# Patient Record
Sex: Female | Born: 1960 | Race: White | Hispanic: No | Marital: Married | Smoking: Never smoker
Health system: Southern US, Community
[De-identification: ages and names within clinical notes are randomized; demographics above are authoritative.]

## PROBLEM LIST (undated history)

## (undated) DIAGNOSIS — G4730 Sleep apnea, unspecified: Secondary | ICD-10-CM

## (undated) DIAGNOSIS — F419 Anxiety disorder, unspecified: Secondary | ICD-10-CM

## (undated) DIAGNOSIS — N200 Calculus of kidney: Secondary | ICD-10-CM

## (undated) DIAGNOSIS — I10 Essential (primary) hypertension: Secondary | ICD-10-CM

## (undated) DIAGNOSIS — K219 Gastro-esophageal reflux disease without esophagitis: Secondary | ICD-10-CM

## (undated) DIAGNOSIS — F32A Depression, unspecified: Secondary | ICD-10-CM

## (undated) DIAGNOSIS — T7840XA Allergy, unspecified, initial encounter: Secondary | ICD-10-CM

## (undated) DIAGNOSIS — M1990 Unspecified osteoarthritis, unspecified site: Secondary | ICD-10-CM

## (undated) DIAGNOSIS — J45909 Unspecified asthma, uncomplicated: Secondary | ICD-10-CM

## (undated) HISTORY — PX: OTHER SURGICAL HISTORY: SHX169

## (undated) HISTORY — DX: Depression, unspecified: F32.A

## (undated) HISTORY — DX: Allergy, unspecified, initial encounter: T78.40XA

## (undated) HISTORY — PX: OVARY SURGERY: SHX727

## (undated) HISTORY — DX: Unspecified osteoarthritis, unspecified site: M19.90

## (undated) HISTORY — DX: Anxiety disorder, unspecified: F41.9

## (undated) HISTORY — PX: PARATHYROID EXPLORATION: SHX732

---

## 1999-07-16 ENCOUNTER — Other Ambulatory Visit: Admission: RE | Admit: 1999-07-16 | Discharge: 1999-07-16 | Payer: Self-pay | Admitting: Family Medicine

## 2000-06-23 ENCOUNTER — Ambulatory Visit (HOSPITAL_BASED_OUTPATIENT_CLINIC_OR_DEPARTMENT_OTHER): Admission: RE | Admit: 2000-06-23 | Discharge: 2000-06-23 | Payer: Self-pay | Admitting: Family Medicine

## 2000-10-29 ENCOUNTER — Encounter
Admission: RE | Admit: 2000-10-29 | Discharge: 2000-11-25 | Payer: Self-pay | Admitting: Physical Medicine and Rehabilitation

## 2003-06-11 ENCOUNTER — Ambulatory Visit (HOSPITAL_BASED_OUTPATIENT_CLINIC_OR_DEPARTMENT_OTHER): Admission: RE | Admit: 2003-06-11 | Discharge: 2003-06-11 | Payer: Self-pay | Admitting: *Deleted

## 2004-04-30 ENCOUNTER — Other Ambulatory Visit: Admission: RE | Admit: 2004-04-30 | Discharge: 2004-04-30 | Payer: Self-pay | Admitting: Obstetrics and Gynecology

## 2004-05-21 ENCOUNTER — Encounter: Admission: RE | Admit: 2004-05-21 | Discharge: 2004-05-21 | Payer: Self-pay | Admitting: Obstetrics and Gynecology

## 2004-06-07 ENCOUNTER — Ambulatory Visit (HOSPITAL_COMMUNITY): Admission: RE | Admit: 2004-06-07 | Discharge: 2004-06-07 | Payer: Self-pay | Admitting: Surgery

## 2004-07-18 ENCOUNTER — Encounter (INDEPENDENT_AMBULATORY_CARE_PROVIDER_SITE_OTHER): Payer: Self-pay | Admitting: *Deleted

## 2004-07-18 ENCOUNTER — Ambulatory Visit (HOSPITAL_COMMUNITY): Admission: RE | Admit: 2004-07-18 | Discharge: 2004-07-19 | Payer: Self-pay | Admitting: Surgery

## 2004-07-30 ENCOUNTER — Ambulatory Visit (HOSPITAL_COMMUNITY): Admission: RE | Admit: 2004-07-30 | Discharge: 2004-07-30 | Payer: Self-pay | Admitting: Obstetrics and Gynecology

## 2005-03-07 ENCOUNTER — Ambulatory Visit (HOSPITAL_BASED_OUTPATIENT_CLINIC_OR_DEPARTMENT_OTHER): Admission: RE | Admit: 2005-03-07 | Discharge: 2005-03-07 | Payer: Self-pay | Admitting: Family Medicine

## 2005-10-13 ENCOUNTER — Ambulatory Visit (HOSPITAL_COMMUNITY): Admission: RE | Admit: 2005-10-13 | Discharge: 2005-10-13 | Payer: Self-pay | Admitting: Obstetrics and Gynecology

## 2006-02-09 IMAGING — US US TRANSVAGINAL NON-OB
2 series · 13 of 25 positions shown · non-contrast
Comparison: None available.

CLINICAL DATA: Left sided pelvic pain.  Irregular menses.  Left adnexal cystic lesion seen on prior office ultrasound.  
TRANSABDOMINAL AND TRANSVAGINAL PELVIC ULTRASOUND:
TECHNIQUE: Both transabdominal and transvaginal ultrasound examinations of the pelvis were performed including evaluation of the uterus, ovaries, adnexal regions, and pelvic cul-de-sac.  Study was limited due to large patient habitus.

[Series 1: us transvaginal non-ob · 0.39mm/px · 11 of 30 slices shown (1 of 2)]
[im 1/30]
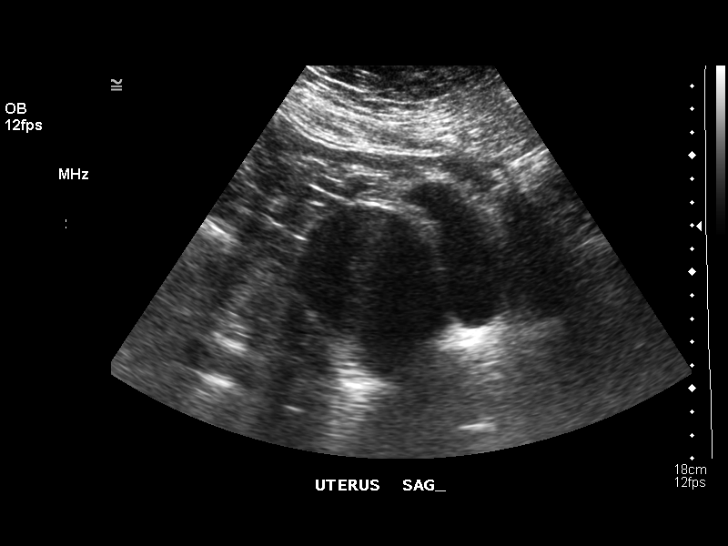
[im 3/30]
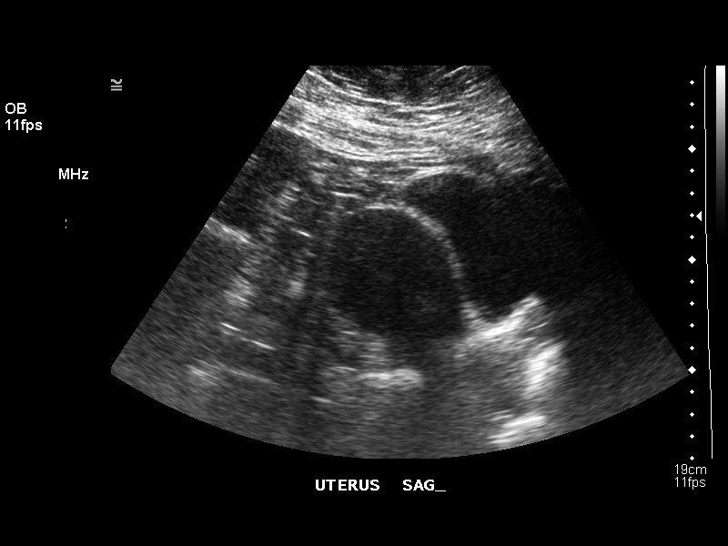
[im 6/30]
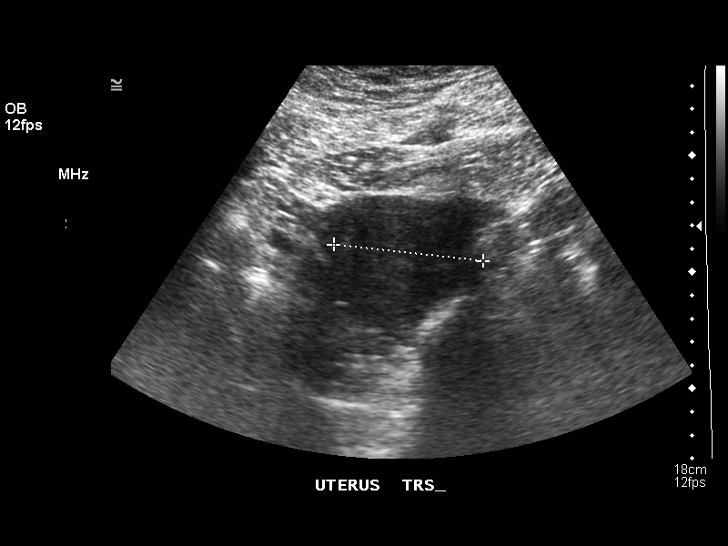
[im 9/30]
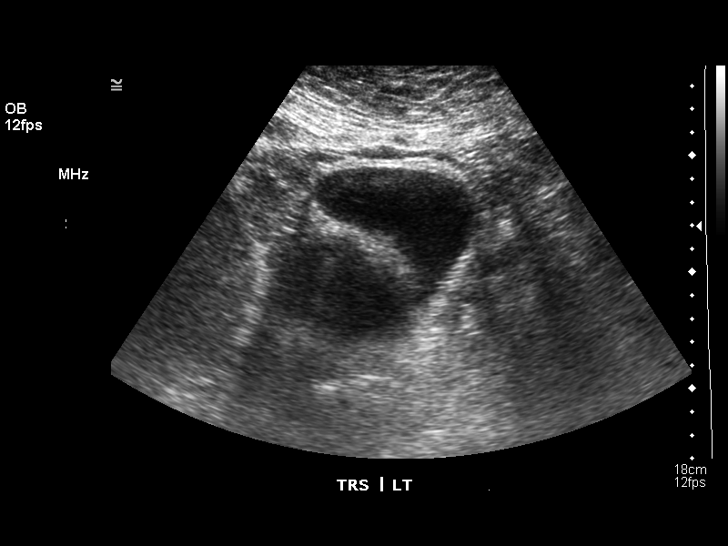
[im 12/30]
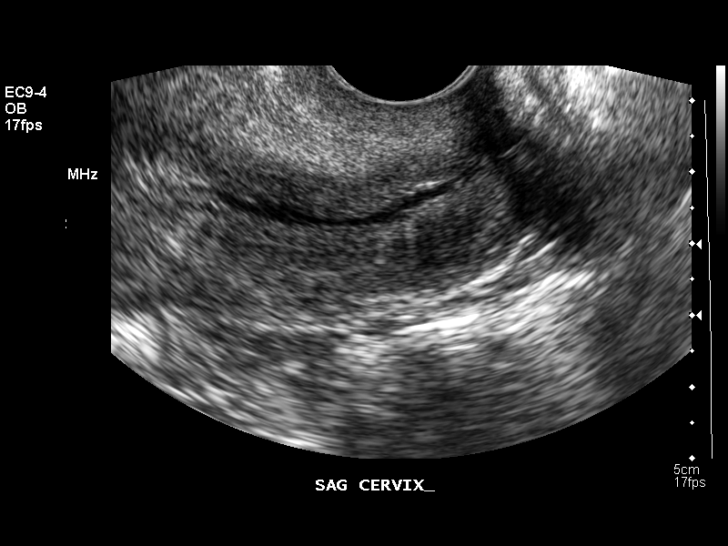
[im 14/30]
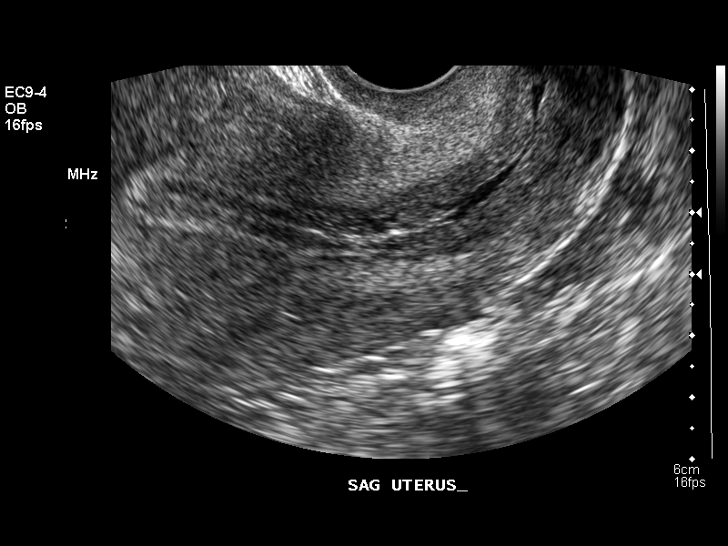
[im 17/30]
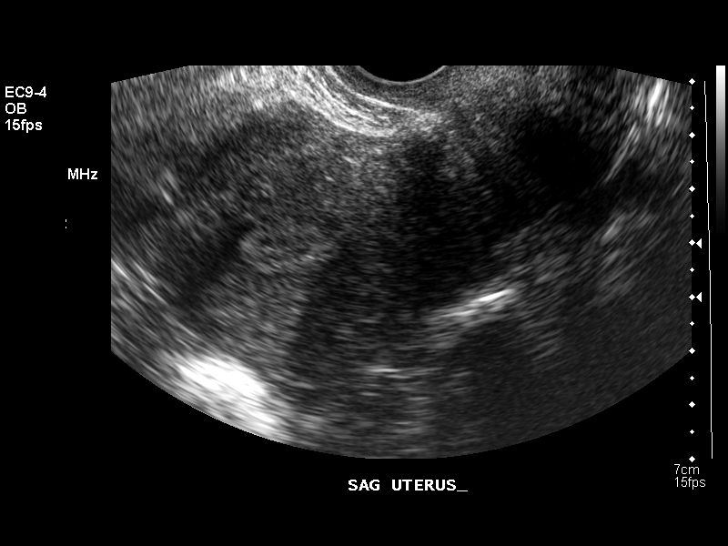
[im 20/30]
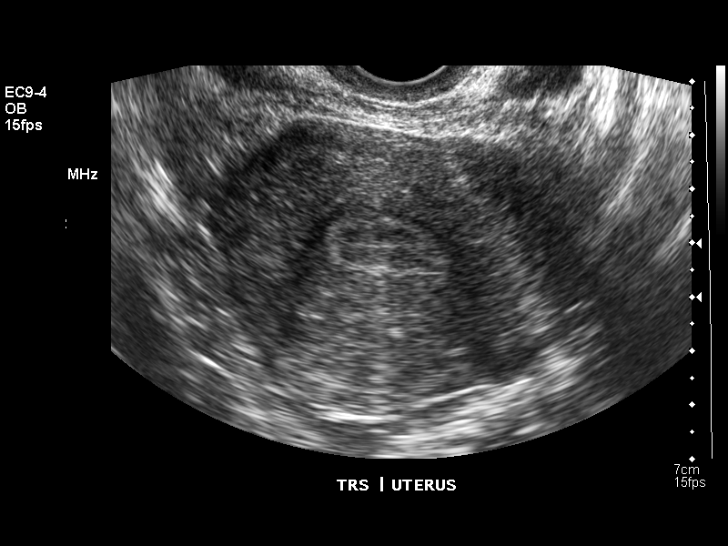
[im 23/30]
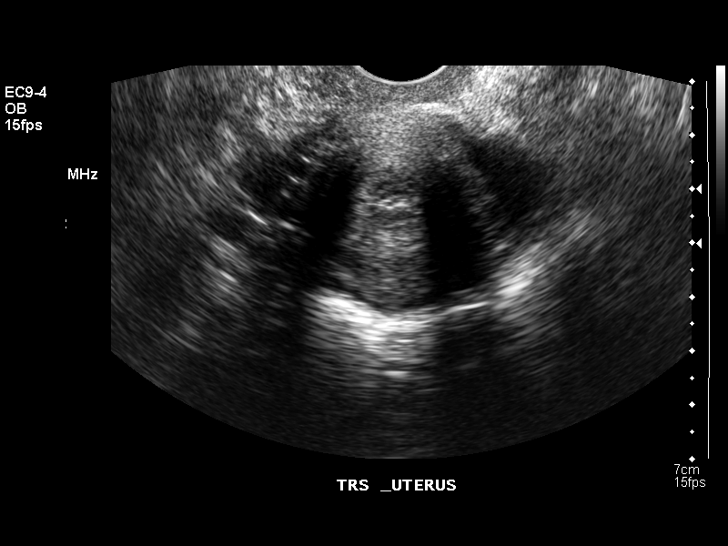
[im 25/30]
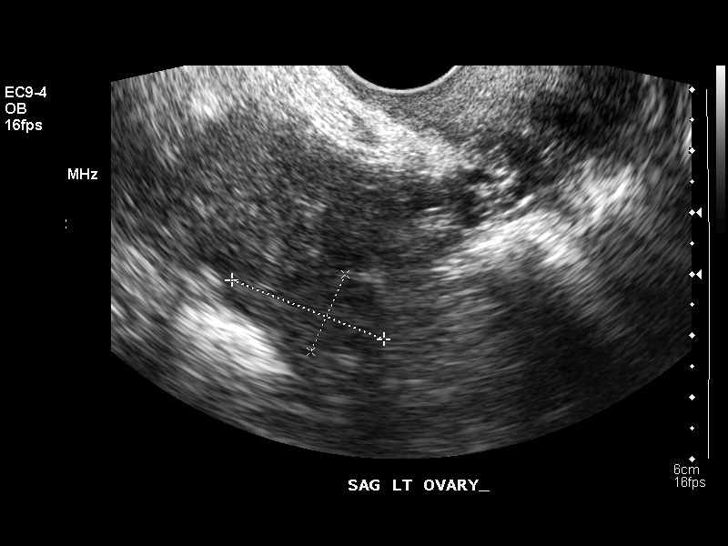
[im 28/30]
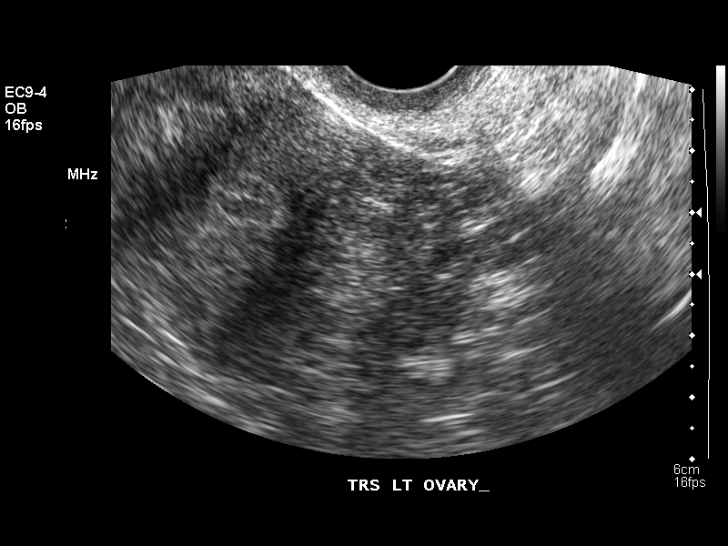

[Series 1: us transvaginal non-ob · 0.37mm/px · 2 of 4 slices shown (2 of 2)]
[im 1/4]
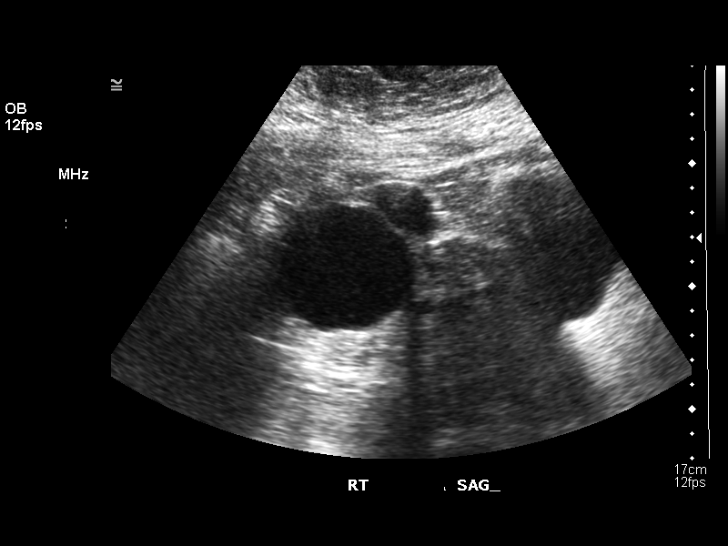
[im 4/4]
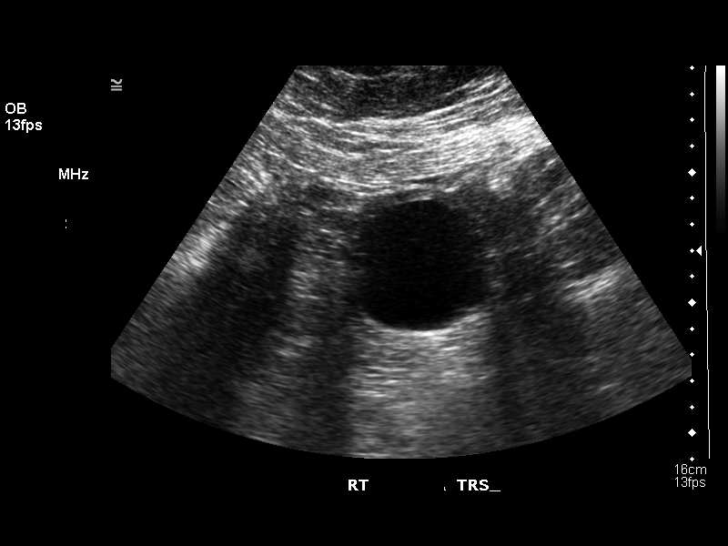

[13 of 25 positions shown; findings below may reference images not displayed]

The uterus is mildly enlarged measuring approximately 11 x 5 x 6.5 cm on transabdominal sonography.  Two simple cysts are seen within the right adnexa on transabdominal sonography which measure 5.0 x 4.8 x 5.6 cm and 3.1 x 2.1 x 2.7 cm.  These are likely ovarian in origin.  
Transvaginal sonography shows a maximum double-layer endometrial thickness of 9 mm.  No fibroids or other uterine abnormality is seen.  Transvaginal exam was limited by patient habitus.  Neither ovary was directly visualized on transvaginal sonography.  No left adnexal masses are seen and there is no evidence of free fluid.
IMPRESSION: 1.  Technically limited study.  Two simple right adnexal cysts are identified measuring 5.6 and 3.1 cm in greatest dimensions.  These are likely ovarian in origin, however no malignant sonographic features are identified.  Follow-up ultrasound is recommended in three months to confirm stability.  
2.  Mildly enlarged uterus which is otherwise normal in appearance.  No evidence of fibroids.
3.  Nonvisualization of the left ovary.

## 2006-05-28 ENCOUNTER — Other Ambulatory Visit: Admission: RE | Admit: 2006-05-28 | Discharge: 2006-05-28 | Payer: Self-pay | Admitting: Obstetrics and Gynecology

## 2007-01-22 ENCOUNTER — Ambulatory Visit (HOSPITAL_BASED_OUTPATIENT_CLINIC_OR_DEPARTMENT_OTHER): Admission: RE | Admit: 2007-01-22 | Discharge: 2007-01-22 | Payer: Self-pay | Admitting: Family Medicine

## 2007-02-27 ENCOUNTER — Ambulatory Visit: Payer: Self-pay | Admitting: Internal Medicine

## 2010-02-04 ENCOUNTER — Ambulatory Visit: Payer: Self-pay | Admitting: Emergency Medicine

## 2010-02-04 DIAGNOSIS — J45909 Unspecified asthma, uncomplicated: Secondary | ICD-10-CM | POA: Insufficient documentation

## 2010-02-04 DIAGNOSIS — I10 Essential (primary) hypertension: Secondary | ICD-10-CM | POA: Insufficient documentation

## 2010-03-16 ENCOUNTER — Encounter: Payer: Self-pay | Admitting: Surgery

## 2010-03-17 ENCOUNTER — Encounter: Payer: Self-pay | Admitting: Obstetrics and Gynecology

## 2010-03-28 NOTE — Assessment & Plan Note (Signed)
Summary: FEVER/WHEEZING/ASTHMA/NAUSEA   Vital Signs:  Patient Profile:   50 Years Old Female CC:      Feels like tongue is swelling x 4 days runny nose, cough, SOB, nausea, diarrhea, body aches x 4 wks Height:     60 inches Weight:      409 pounds O2 Sat:      96 % O2 treatment:    Room Air Temp:     98.1 degrees F oral Pulse rate:   82 / minute Pulse rhythm:   regular Resp:     20 per minute BP sitting:   182 / 78  (left arm) Cuff size:   large  Vitals Entered By: Emilio Math (February 04, 2010 10:00 AM)                  Current Allergies: ! SULFA ! PENICILLIN ! DOXYCYCLINEHistory of Present Illness History from: patient Chief Complaint: Feels like tongue is swelling x 4 days runny nose, cough, SOB, nausea, diarrhea, body aches x 4 wks History of Present Illness: Patient complains of onset of cold symptoms for 4 wks.  They have been using OTC cough & cold meds which is helping a little bit. + sore throat + cough No pleuritic pain + wheezing + nasal congestion + post-nasal drainage No sinus pain/pressure No itchy/red eyes No earache No hemoptysis + SOB + chills/sweats No fever No nausea No vomiting No abdominal pain + diarrhea No skin rashes No fatigue No myalgias No headache   REVIEW OF SYSTEMS Constitutional Symptoms      Denies fever, chills, night sweats, weight loss, weight gain, and fatigue.  Eyes       Complains of eye drainage.      Denies change in vision, eye pain, glasses, contact lenses, and eye surgery. Ear/Nose/Throat/Mouth       Complains of frequent runny nose, sinus problems, sore throat, and hoarseness.      Denies hearing loss/aids, change in hearing, ear pain, ear discharge, dizziness, frequent nose bleeds, and tooth pain or bleeding.  Respiratory       Complains of dry cough, wheezing, shortness of breath, and asthma.      Denies productive cough, bronchitis, and emphysema/COPD.  Cardiovascular       Denies murmurs, chest pain,  and tires easily with exhertion.    Gastrointestinal       Complains of nausea/vomiting and diarrhea.      Denies stomach pain, constipation, blood in bowel movements, and indigestion.      Comments: Nausea Genitourniary       Denies painful urination, kidney stones, and loss of urinary control. Neurological       Denies paralysis, seizures, and fainting/blackouts. Musculoskeletal       Complains of muscle pain and joint pain.      Denies joint stiffness, decreased range of motion, redness, swelling, muscle weakness, and gout.  Skin       Denies bruising, unusual mles/lumps or sores, and hair/skin or nail changes.  Psych       Denies mood changes, temper/anger issues, anxiety/stress, speech problems, depression, and sleep problems.  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 small oral ulcer R  tongue Neck: neck supple,  trachea midline, no masses Chest/Lungs: no rales, wheezes, or rhonchi bilateral, breath sounds equal without effort Heart: regular rate and  rhythm, no murmur Skin: no obvious rashes or lesions MSE: oriented to time, place, and person Assessment New Problems: UPPER RESPIRATORY INFECTION, ACUTE (ICD-465.9) HYPERTENSION (ICD-401.9) ASTHMA (ICD-493.90)  likely viral  Patient Education: Patient and/or caregiver instructed in the following: rest, fluids, diet, weight loss.  Plan New Medications/Changes: ZITHROMAX Z-PAK 250 MG TABS (AZITHROMYCIN) use as directed  #1 x 0, 02/04/2010, Hoyt Koch MD CHERATUSSIN AC 100-10 MG/5ML SYRP (GUAIFENESIN-CODEINE) 5cc Q6 hrs as needed  cough  #5oz x 0, 02/04/2010, Hoyt Koch MD PREDNISONE (PAK) 10 MG TABS (PREDNISONE) use as directed (6 day pack)  #QS x 0, 02/04/2010, Hoyt Koch MD  New Orders: New Patient Level III [99203] Pulse Oximetry [94760] Nebulizer Tx [94640] Ipratropium inhalation sol. unit dose [Z6109] Albuterol Sulfate Sol 1mg  unit dose [U0454] Planning Comments:   1)  Take the prescribed antibiotic as instructed (hold Zpak for 5 days to see if you get better) 2)  Use nasal saline solution (over the counter) at least 3 times a day. 3)  Use over the counter decongestants like Zyrtec-D every 12 hours as needed to help with congestion. 4)  Can take tylenol every 6 hours or motrin every 8 hours for pain or fever. 5)  Follow up with your primary doctor  if no improvement in 5-7 days, sooner if increasing pain, fever, or new symptoms.  Follow up with PCP on your HTN.    The patient and/or caregiver has been counseled thoroughly with regard to medications prescribed including dosage, schedule, interactions, rationale for use, and possible side effects and they verbalize understanding.  Diagnoses and expected course of recovery discussed and will return if not improved as expected or if the condition worsens. Patient and/or caregiver verbalized understanding.   PROCEDURE: Prescriptions: ZITHROMAX Z-PAK 250 MG TABS (AZITHROMYCIN) use as directed  #1 x 0   Entered and Authorized by:   Hoyt Koch MD   Signed by:   Hoyt Koch MD on 02/04/2010   Method used:   Print then Give to Patient   RxID:   [id number] CHERATUSSIN AC 100-10 MG/5ML SYRP (GUAIFENESIN-CODEINE) 5cc Q6 hrs as needed cough  #5oz x 0   Entered and Authorized by:   Hoyt Koch MD   Signed by:   Hoyt Koch MD on 02/04/2010   Method used:   Print then Give to Patient   RxID:   [id number] PREDNISONE (PAK) 10 MG TABS (PREDNISONE) use as directed (6 day pack)  #QS x 0   Entered and Authorized by:   Hoyt Koch MD   Signed by:   Hoyt Koch MD on 02/04/2010   Method used:   Print then Give to Patient   RxID:   [id number]   Patient Instructions:  Medication Administration  Medication # 1:    Medication: Ipratropium inhalation sol. unit dose    Diagnosis: ASTHMA (ICD-493.90)    Dose: 0.5    Route: inhaled    Exp Date: 04/25/2010    Lot #: [id number]    Mfr: Mylan    Given by: Emilio Math (February 04, 2010 10:56 AM)  Medication # 2:    Medication: Albuterol Sulfate Sol 1mg  unit dose    Diagnosis: ASTHMA (ICD-493.90)    Dose: 3.0    Route: inhaled    Exp Date: 04/25/2010    Lot #: [id number]    Mfr:  Mylan    Patient tolerated medication without complications    Given by: Emilio Math (February 04, 2010 10:56 AM)  Orders Added: 1)  New Patient Level III [99203] 2)  Pulse Oximetry [94760] 3)  Nebulizer Tx [94640] 4)  Ipratropium inhalation sol. unit dose [J7644] 5)  Albuterol Sulfate Sol 1mg  unit dose [X9147]     Medication Administration  Medication # 1:    Medication: Ipratropium inhalation sol. unit dose    Diagnosis: ASTHMA (ICD-493.90)    Dose: 0.5    Route: inhaled    Exp Date: 04/25/2010    Lot #: [id number]    Mfr: Mylan    Given by: Emilio Math (February 04, 2010 10:56 AM)  Medication # 2:    Medication: Albuterol Sulfate Sol 1mg  unit dose    Diagnosis: ASTHMA (ICD-493.90)    Dose: 3.0    Route: inhaled    Exp Date: 04/25/2010    Lot #: [id number]    Mfr: Mylan    Patient tolerated medication without complications    Given by: Emilio Math (February 04, 2010 10:56 AM)  Orders Added: 1)  New Patient Level III [99203] 2)  Pulse Oximetry [94760] 3)  Nebulizer Tx [94640] 4)  Ipratropium inhalation sol. unit dose [J7644] 5)  Albuterol Sulfate Sol 1mg  unit dose [W2956]

## 2010-07-09 NOTE — Procedures (Signed)
NAME:  Brittney Murray, HOOK                ACCOUNT NO.:  [id number]   MEDICAL RECORD NO.:  [id number]          PATIENT TYPE:  OUT   LOCATION:  SLEEP CENTER                 FACILITY[facility]  [facility]   PHYSICIAN:  Clinton D. Maple Hudson, MD, FCCP, FACPDATE OF BIRTH:  Jun 05, 1961   DATE OF STUDY:  01/22/2007                            NOCTURNAL POLYSOMNOGRAM   REFERRING PHYSICIAN:  Pearson Forster, M.D.   INDICATION FOR STUDY:  Hypersomnia with sleep apnea.  BMI 74.  Weight  380 pounds.  Height 60 inches.  Neck 17 inches.   EPWORTH SLEEPINESS SCORE:  13/24   MEDICATIONS:  Home medications are listed and reviewed.  CPAP titration  protocol is requested.  An original diagnostic study in 2002 is no  longer available.  A CPAP study on June 11, 2003, titrated to 9 CWP for  an AHI of 0 per hour.   SLEEP ARCHITECTURE:  Total sleep time 275 minutes with sleep efficiency  67%.  Stage I was 11%, stage II 67%, stage III 0.2%, REM 21% of total  sleep time.  Sleep latency 41 minutes, REM latency 170 minutes.  Awake  after sleep onset 91 minutes.  Arousal index 9.6.  Equate PM 500 mg two  tablets were taken at 10:15 p.m.   RESPIRATORY DATA:  CPAP titration protocol.  CPAP was titrated to 12  CWP, AHI 0 per hour.  Her own Profile Lite mask size small was used with  passover humidifier.   OXYGEN DATA:  Snoring was prevented by CPAP with mean oxygen saturation  held at 96.9% on room air.   CARDIAC DATA:  Normal sinus rhythm.   MOVEMENT-PARASOMNIA:  No significant movement disturbance, no bathroom  trips.   IMPRESSIONS-RECOMMENDATIONS:  Successful CPAP titration to 12 CWP, AHI 0  per hour.  Her own small Profile Lite mask was used with a passover  humidifier.  Apparently she has not had a humidifier at home and  indicated that this was substantially more comfortable, hoping that she  can get a humidifier attachment added.  This may address complaints of  nasal congestion previously.     Clinton D. Maple Hudson, MD,  [facility], FACP  Diplomate, Biomedical engineer of Sleep Medicine  Electronically Signed    CDY/MEDQ  D:  01/31/2007 13:50:13  T:  01/31/2007 22:47:11  Job:  [id number]

## 2010-07-12 NOTE — Op Note (Signed)
NAME:  Brittney Murray, Brittney Murray                ACCOUNT NO.:  [id number]   MEDICAL RECORD NO.:  [id number]          PATIENT TYPE:  OIB   LOCATION:  2550                         FACILITY:  [facility]   PHYSICIAN:  Velora Heckler, MD      DATE OF BIRTH:  [birth year]   DATE OF PROCEDURE:  DATE OF DISCHARGE:                                 OPERATIVE REPORT   PREOPERATIVE DIAGNOSIS:  Primary hyperparathyroidism.   POSTOPERATIVE DIAGNOSIS:  Primary hyperparathyroidism.   PROCEDURES:  1.  Left inferior parathyroidectomy.  2.  Neck exploration.   SURGEON:  Velora Heckler, M.D.   ASSISTANT:  Cherylynn Ridges, M.D.   ANESTHESIA:  General per Dr. Edrick Oh BLOOD LOSS:  Minimal.   PREPARATION:  Betadine.   COMPLICATIONS:  None.   INDICATIONS:  The patient is a 50 year old white female who has biochemical  evidence of primary hyperparathyroidism. However, localization studies  including MRI scan and Sestamibi scan failed to localize a parathyroid  adenoma. The patient has osteoporosis and high urinary calcium levels. She  now comes to surgery for neck exploration.   DESCRIPTION OF PROCEDURE:  Procedure was done in OR #16 at the [facility].  The patient was brought to the operating room and placed  in supine position on the operating room table. Following administration of  general anesthesia, the patient was prepped and draped in the usual strict  aseptic fashion. After ascertaining that an adequate level of anesthesia had  been obtained, a Kocher incision was made with a #15 blade. Dissection was  carried down through subcutaneous tissues and hemostasis obtained with the  electrocautery. Platysma was divided with the electrocautery. Skin flaps  were developed cephalad and caudad from the thyroid notch to the sternal  notch. A Mayhorn self-retaining retractor was placed for exposure. Strap  muscles were incised in the midline.  Dissection was begun in the left neck.  Strap muscles were reflected laterally. Left thyroid lobe was dissected out.  Venous tributaries were divided between small and medium Ligaclips.  Exploration in the left inferior position reveals a slightly prominent  thyrothymic tract. This was opened. It contains adipose tissue but does  contain a brownish glandular tissue which was carefully dissected out. This  was felt to represent an enlarged parathyroid gland. It is dissected out.  Vascular pedicle was divided with medium hemoclips. Adipose tissue distally  towards the thymus is transected with the electrocautery and the gland is  excised. It is submitted fresh to pathology where Dr. Kieth Brightly performed  frozen section confirming parathyroid tissue which may represent parathyroid  adenoma. Good hemostasis is noted. Further exploration in the left neck  rolling the thyroid gland anteriorly reveals a normal left superior  parathyroid gland. Biopsy is not taken.   Next, we explored the right neck. Right thyroid lobe was mobilized. Venous  tributaries were divided between small and medium Ligaclips. Posteriorly,  the tracheoesophageal groove is explored gently. No parathyroid tissue is  identified. However, no enlarged parathyroid tissue is identified.  Thyrothymic tract is opened, and there is  no sign of ectopic parathyroid  tissue. No palpable masses are identifiable. Given the previous findings,  exploration is discontinued. Surgicel is placed behind the thyroid lobes  bilaterally. Strap muscles are reapproximated in the midline with  interrupted 4-0 Vicryl sutures. Platysma is closed with interrupted 4-0  Vicryl sutures. Skin is closed with a running 4-0 Vicryl subcuticular  suture. Wound is washed and dried, and Benzoin and Steri-Strips are applied.  Sterile dressings are applied. The patient is awakened from anesthesia and  brought to the recovery room in stable condition. The patient tolerated the  procedure well.       TMG/MEDQ  D:  07/18/2004  T:  07/18/2004  Job:  [id number]   cc:   Velora Heckler, MD  [address] [place]  [place] [address]

## 2010-08-18 ENCOUNTER — Inpatient Hospital Stay (INDEPENDENT_AMBULATORY_CARE_PROVIDER_SITE_OTHER)
Admission: RE | Admit: 2010-08-18 | Discharge: 2010-08-18 | Disposition: A | Discharge status: Home / Self Care | Payer: BC Managed Care – PPO | Source: Ambulatory Visit | Attending: Emergency Medicine | Admitting: Emergency Medicine

## 2010-08-18 ENCOUNTER — Encounter: Payer: Self-pay | Admitting: Emergency Medicine

## 2010-08-18 DIAGNOSIS — N390 Urinary tract infection, site not specified: Secondary | ICD-10-CM

## 2010-08-18 LAB — CONVERTED CEMR LAB
Glucose, Urine, Semiquant: NEGATIVE
Ketones, urine, test strip: NEGATIVE
Protein, U semiquant: 1
WBC Urine, dipstick: NEGATIVE
pH: 6

## 2010-08-20 ENCOUNTER — Telehealth (INDEPENDENT_AMBULATORY_CARE_PROVIDER_SITE_OTHER): Payer: Self-pay | Admitting: *Deleted

## 2011-01-27 NOTE — Telephone Encounter (Signed)
  Phone Note Outgoing Call   Call placed by: Clemens Catholic LPN,  August 20, 2010 2:49 PM Summary of Call: call back: called to f/u with pt and give her urine culture results to her. no answer, unable to leave message. Initial call taken by: Clemens Catholic LPN,  August 20, 2010 2:50 PM

## 2011-01-27 NOTE — Progress Notes (Signed)
Summary: uti?/TM (room 4)   Vital Signs:  Patient Profile:   50 Years Old Female CC:      Left flank and back pain; dysuria Height:     60 inches Weight:      413 pounds O2 Sat:      94 % O2 treatment:    Room Air Temp:     98.5 degrees F oral Pulse rate:   111 / minute Resp:     20 per minute BP sitting:   149 / 85  (right arm) Cuff size:   large  Pt. in pain?   yes  Vitals Entered By: Lavell Islam RN (August 18, 2010 5:45 PM)                   Updated Prior Medication List: LISINOPRIL 10 MG TABS (LISINOPRIL)  CHERATUSSIN AC 100-10 MG/5ML SYRP (GUAIFENESIN-CODEINE) 5cc Q6 hrs as needed cough  Current Allergies (reviewed today): ! SULFA ! PENICILLIN ! DOXYCYCLINEHistory of Present Illness History from: patient Chief Complaint: Left flank and back pain; dysuria History of Present Illness: 50 Years Old Female complains of UTI symptoms for a few days.  She describes the pain as burning during urination.  She has not used any OTC meds. + dysuria + frequency No urgency + mild hematuria today No vaginal discharge No fever/chills + mild lower abdomenal pain + mild L back pain No fatigue   REVIEW OF SYSTEMS Constitutional Symptoms       Complains of fatigue.     Denies fever, chills, night sweats, weight loss, and weight gain.  Eyes       Denies change in vision, eye pain, eye discharge, glasses, contact lenses, and eye surgery. Ear/Nose/Throat/Mouth       Denies hearing loss/aids, change in hearing, ear pain, ear discharge, dizziness, frequent runny nose, frequent nose bleeds, sinus problems, sore throat, hoarseness, and tooth pain or bleeding.  Respiratory       Complains of shortness of breath.      Denies dry cough, productive cough, wheezing, asthma, bronchitis, and emphysema/COPD.  Cardiovascular       Denies murmurs, chest pain, and tires easily with exhertion.    Gastrointestinal       Complains of nausea/vomiting.      Denies stomach pain, diarrhea,  constipation, blood in bowel movements, and indigestion. Genitourniary       Complains of painful urination.      Denies kidney stones and loss of urinary control. Neurological       Denies paralysis, seizures, and fainting/blackouts. Musculoskeletal       Denies muscle pain, joint pain, joint stiffness, decreased range of motion, redness, swelling, muscle weakness, and gout.  Skin       Denies bruising, unusual mles/lumps or sores, and hair/skin or nail changes.  Psych       Denies mood changes, temper/anger issues, anxiety/stress, speech problems, depression, and sleep problems. 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 Asthma Hypertension  Past Surgical History: Reviewed history from 02/04/2010 and no changes required. Parathyroid removed GYN surgery  Family History: Reviewed history from 02/04/2010 and no changes required. Mother, Diabetes Father, Heart Problems, COPD  Social History: Reviewed history from 02/04/2010 and no changes required. 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 Patient Level IV [16109] T-Culture, Urine [60454-09811] UA Dipstick w/o Micro (automated)  [81003] Planning Comments:   UA shows more evidence of stone than infection.  A strainer is  given.  Increase hydration.  Will go ahead and treat with Cipro.  Can use Motrin as needed or OTC Azo to help with the burning.  Follow-up with your primary care physician or ER if not improving or if getting worse.  A urine culture is pending. If not improving, may need further studies (CT scan) to r/o kidney stone.  Will call pt in 2 days with culture results and see how she's doing.    The patient and/or caregiver has been counseled thoroughly with regard to medications prescribed including dosage, schedule, interactions, rationale for use, and possible side effects and they verbalize understanding.  Diagnoses and expected course of recovery discussed and will return if not improved as expected or if the condition worsens. Patient and/or caregiver verbalized understanding.  Prescriptions: CIPROFLOXACIN HCL 500 MG TABS (CIPROFLOXACIN HCL) 1 by mouth two times a day for 7 days  #14 x 0   Entered and Authorized by:   Hoyt Koch MD   Signed by:   Hoyt Koch MD on 08/18/2010   Method used:   Print then Give to Patient   RxID:   [id number]   Orders Added: 1)  Est. Patient Level IV [78469] 2)  T-Culture, Urine [[id number]] 3)  UA Dipstick w/o Micro (automated)  [81003]    Laboratory Results   Urine Tests  Date/Time Received: August 18, 2010 6:02 PM  Date/Time Reported: August 18, 2010 6:02 PM   Routine Urinalysis   Color: red Appearance: Hazy Glucose: negative   (Normal Range: Negative) Bilirubin: negative   (Normal Range: Negative) Ketone: negative   (Normal Range: Negative) Spec. Gravity: >=1.030   (Normal Range: 1.003-1.035) Blood: +3   (Normal Range: Negative) pH: 6.0   (Normal Range: 5.0-8.0) Protein: +1   (Normal Range: Negative) Urobilinogen: 0.2   (Normal Range: 0-1) Nitrite: negative   (Normal Range: Negative) Leukocyte Esterace: negative   (Normal Range: Negative)    Comments: left flank pain and dysuria

## 2011-07-30 ENCOUNTER — Encounter: Payer: Self-pay | Admitting: Emergency Medicine

## 2011-07-30 ENCOUNTER — Emergency Department
Admission: EM | Admit: 2011-07-30 | Discharge: 2011-07-30 | Disposition: A | Discharge status: Home / Self Care | Payer: BC Managed Care – PPO | Source: Home / Self Care

## 2011-07-30 DIAGNOSIS — R42 Dizziness and giddiness: Secondary | ICD-10-CM

## 2011-07-30 DIAGNOSIS — J40 Bronchitis, not specified as acute or chronic: Secondary | ICD-10-CM

## 2011-07-30 HISTORY — DX: Unspecified asthma, uncomplicated: J45.909

## 2011-07-30 HISTORY — DX: Essential (primary) hypertension: I10

## 2011-07-30 MED ORDER — MECLIZINE HCL 25 MG PO TABS: 25.0000 mg | ORAL_TABLET | Freq: Three times a day (TID) | ORAL | Status: AC | PRN

## 2011-07-30 MED ORDER — METHYLPREDNISOLONE SODIUM SUCC 125 MG IJ SOLR
125.0000 mg | Freq: Once | INTRAMUSCULAR | Status: AC
  Administered 2011-07-30: 125 mg via INTRAMUSCULAR

## 2011-07-30 MED ORDER — PREDNISONE 50 MG PO TABS: ORAL_TABLET | ORAL | Status: AC

## 2011-07-30 MED ORDER — AZITHROMYCIN 250 MG PO TABS: ORAL_TABLET | ORAL | Status: AC

## 2011-07-30 NOTE — ED Provider Notes (Signed)
Agree with exam, assessment, and plan.   Lattie Haw, MD 07/30/11 1228

## 2011-07-30 NOTE — Discharge Instructions (Signed)
Bronchitis Bronchitis is the body's way of reacting to injury and/or infection (inflammation) of the bronchi. Bronchi are the air tubes that extend from the windpipe into the lungs. If the inflammation becomes severe, it may cause shortness of breath. CAUSES  Inflammation may be caused by:  A virus.   Germs (bacteria).   Dust.   Allergens.   Pollutants and many other irritants.  The cells lining the bronchial tree are covered with tiny hairs (cilia). These constantly beat upward, away from the lungs, toward the mouth. This keeps the lungs free of pollutants. When these cells become too irritated and are unable to do their job, mucus begins to develop. This causes the characteristic cough of bronchitis. The cough clears the lungs when the cilia are unable to do their job. Without either of these protective mechanisms, the mucus would settle in the lungs. Then you would develop pneumonia. Smoking is a common cause of bronchitis and can contribute to pneumonia. Stopping this habit is the single most important thing you can do to help yourself. TREATMENT   Your caregiver may prescribe an antibiotic if the cough is caused by bacteria. Also, medicines that open up your airways make it easier to breathe. Your caregiver may also recommend or prescribe an expectorant. It will loosen the mucus to be coughed up. Only take over-the-counter or prescription medicines for pain, discomfort, or fever as directed by your caregiver.   Removing whatever causes the problem (smoking, for example) is critical to preventing the problem from getting worse.   Cough suppressants may be prescribed for relief of cough symptoms.   Inhaled medicines may be prescribed to help with symptoms now and to help prevent problems from returning.   For those with recurrent (chronic) bronchitis, there may be a need for steroid medicines.  SEEK IMMEDIATE MEDICAL CARE IF:   During treatment, you develop more pus-like mucus  (purulent sputum).   You have a fever.   Your baby is older than 3 months with a rectal temperature of 102 F (38.9 C) or higher.   Your baby is 61 months old or younger with a rectal temperature of 100.4 F (38 C) or higher.   You become progressively more ill.   You have increased difficulty breathing, wheezing, or shortness of breath.  It is necessary to seek immediate medical care if you are elderly or sick from any other disease. MAKE SURE YOU:   Understand these instructions.   Will watch your condition.   Will get help right away if you are not doing well or get worse.  Document Released: 02/10/2005 Document Revised: 01/30/2011 Document Reviewed: 12/21/2007 [facility] Patient Information 2012 [place], [place].  Vertigo Vertigo means you feel like you or your surroundings are moving when they are not. Vertigo can be dangerous if it occurs when you are at work, driving, or performing difficult activities.  CAUSES  Vertigo occurs when there is a conflict of signals sent to your brain from the visual and sensory systems in your body. There are many different causes of vertigo, including:  Infections, especially in the inner ear.   A bad reaction to a drug or misuse of alcohol and medicines.   Withdrawal from drugs or alcohol.   Rapidly changing positions, such as lying down or rolling over in bed.   A migraine headache.   Decreased blood flow to the brain.   Increased pressure in the brain from a head injury, infection, tumor, or bleeding.  SYMPTOMS  You may  feel as though the world is spinning around or you are falling to the ground. Because your balance is upset, vertigo can cause nausea and vomiting. You may have involuntary eye movements (nystagmus). DIAGNOSIS  Vertigo is usually diagnosed by physical exam. If the cause of your vertigo is unknown, your caregiver may perform imaging tests, such as an MRI scan (magnetic resonance imaging). TREATMENT  Most cases of  vertigo resolve on their own, without treatment. Depending on the cause, your caregiver may prescribe certain medicines. If your vertigo is related to body position issues, your caregiver may recommend movements or procedures to correct the problem. In rare cases, if your vertigo is caused by certain inner ear problems, you may need surgery. HOME CARE INSTRUCTIONS   Follow your caregiver's instructions.   Avoid driving.   Avoid operating heavy machinery.   Avoid performing any tasks that would be dangerous to you or others during a vertigo episode.   Tell your caregiver if you notice that certain medicines seem to be causing your vertigo. Some of the medicines used to treat vertigo episodes can actually make them worse in some people.  SEEK IMMEDIATE MEDICAL CARE IF:   Your medicines do not relieve your vertigo or are making it worse.   You develop problems with talking, walking, weakness, or using your arms, hands, or legs.   You develop severe headaches.   Your nausea or vomiting continues or gets worse.   You develop visual changes.   A family member notices behavioral changes.   Your condition gets worse.  MAKE SURE YOU:  Understand these instructions.   Will watch your condition.   Will get help right away if you are not doing well or get worse.  Document Released: 11/20/2004 Document Revised: 01/30/2011 Document Reviewed: 08/29/2010 [facility] Patient Information 2012 [place], [place].

## 2011-07-30 NOTE — ED Notes (Signed)
Runny nose, Body aches, Cough, Tired, Dizzy x 4 weeks

## 2011-07-30 NOTE — ED Provider Notes (Signed)
History     CSN: [id number]  Arrival date & time 07/30/11  1114   First MD Initiated Contact with Patient 07/30/11 1115      No chief complaint on file. HPI Comments: URI Symptoms Onset: 2-3 weeks  Description: rhinorrhea, nasal congestion, cough, mild wheezing and increased WOB Modifying factors:  Morbidly obese, baseline hx/o asthma   Symptoms Nasal discharge: yes Fever: no Sore throat: mild Cough: yes Wheezing: yes Ear pain: no GI symptoms: no Sick contacts: yes; husband  Red Flags  Stiff neck: no Dyspnea: no Rash: no Swallowing difficulty: no  Sinusitis Risk Factors Headache/face pain: no Double sickening: no tooth pain: no  Allergy Risk Factors Sneezing: yes Itchy scratchy throat: yes Seasonal symptoms: no  Flu Risk Factors Headache: no muscle aches: no severe fatigue: yes  Pt also with vertigonous sxs over last 2-3 weeks. Feeling of room spinning going from sitting to standing. No trauma or hearing loss.      No past medical history on file.  No past surgical history on file.  No family history on file.  History  Substance Use Topics  . Smoking status: Not on file  . Smokeless tobacco: Not on file  . Alcohol Use: Not on file    OB History    No data available      Review of Systems  All other systems reviewed and are negative.    Allergies  Doxycycline; Penicillins; and Sulfonamide derivatives  Home Medications  No current outpatient prescriptions on file.  There were no vitals taken for this visit.  Physical Exam  Constitutional: She appears well-developed and well-nourished.       Morbidly obese   HENT:  Head: Normocephalic and atraumatic.  Right Ear: External ear normal.  Left Ear: External ear normal.       +nasal erythema, rhinorrhea bilaterally, + post oropharyngeal erythema  Large neck girth    Eyes: Conjunctivae are normal. Pupils are equal, round, and reactive to light.  Neck:       Large neck girth     Cardiovascular: Normal rate and regular rhythm.   Pulmonary/Chest: Effort normal and breath sounds normal. She has no wheezes.  Abdominal:       Obese  Musculoskeletal: Normal range of motion.  Lymphadenopathy:    She has no cervical adenopathy.  Neurological: She is alert.  Skin: Skin is warm.  + horizontal nystagmus and + dix hallpike   ED Course  Procedures (including critical care time)  Labs Reviewed - No data to display No results found.   No diagnosis found.    MDM  Viral/allergic induced bronchitic flare in setting of underlying asthma and OHS. Will rx prednisone and azithromycin (for atypical coverage).  Meclizine for vertigo.  Infectious red flags discussed. Follow up as needed.      The patient and/or caregiver has been counseled thoroughly with regard to treatment plan and/or medications prescribed including dosage, schedule, interactions, rationale for use, and possible side effects and they verbalize understanding. Diagnoses and expected course of recovery discussed and will return if not improved as expected or if the condition worsens. Patient and/or caregiver verbalized understanding.             Floydene Flock, MD 07/30/11 1154

## 2012-06-06 ENCOUNTER — Emergency Department
Admission: EM | Admit: 2012-06-06 | Discharge: 2012-06-06 | Disposition: A | Discharge status: Home / Self Care | Payer: BC Managed Care – PPO | Source: Home / Self Care | Attending: Family Medicine | Admitting: Family Medicine

## 2012-06-06 ENCOUNTER — Encounter: Payer: Self-pay | Admitting: Emergency Medicine

## 2012-06-06 ENCOUNTER — Emergency Department (INDEPENDENT_AMBULATORY_CARE_PROVIDER_SITE_OTHER): Payer: BC Managed Care – PPO

## 2012-06-06 DIAGNOSIS — M25569 Pain in unspecified knee: Secondary | ICD-10-CM

## 2012-06-06 DIAGNOSIS — M1711 Unilateral primary osteoarthritis, right knee: Secondary | ICD-10-CM

## 2012-06-06 DIAGNOSIS — M1710 Unilateral primary osteoarthritis, unspecified knee: Secondary | ICD-10-CM

## 2012-06-06 HISTORY — DX: Calculus of kidney: N20.0

## 2012-06-06 HISTORY — DX: Sleep apnea, unspecified: G47.30

## 2012-06-06 HISTORY — DX: Gastro-esophageal reflux disease without esophagitis: K21.9

## 2012-06-06 MED ORDER — HYDROCODONE-ACETAMINOPHEN 5-325 MG PO TABS: ORAL_TABLET | ORAL | Status: DC

## 2012-06-06 NOTE — ED Notes (Signed)
Patient reports pain right knee; only known incident wasre- twisting it, and this had happened about a year ago the first time,  Without an evaluation/treatment.

## 2012-06-06 NOTE — ED Provider Notes (Signed)
History     CSN: [id number]  Arrival date & time 06/06/12  1237   First MD Initiated Contact with Patient 06/06/12 1338      Chief Complaint  Patient presents with  . 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  Her job is mostly sedentary, but she does walk more that she has been accustomed to, and she has developed persistent pain in the anterior and medial aspects of her right knee.  The pain is worse when climbing stairs and with twisting movements of her knee.  The knee often feels as if it may give way.  About a week prior to beginning her new job, she recalls crawling over a seat in her [employer] to reach an object, and she experienced subsequent pain in the region of her right patella that improved.  She states that she has on hand Relafen, and takes two tabs each morning for back pain.  Relafen has not been helpful for her right knee pain.  Patient is a 52 y.o. female presenting with knee pain. The history is provided by the patient.  Knee Pain Location:  Knee Time since incident:  2 weeks Injury: no   Knee location:  R knee Pain details:    Quality:  Aching   Radiates to:  Does not radiate   Severity:  Moderate   Onset quality:  Sudden   Timing:  Intermittent   Progression:  Unchanged Chronicity:  Recurrent Dislocation: no   Prior injury to area:  Yes Relieved by:  Nothing Worsened by:  Bearing weight and activity Ineffective treatments:  Muscle relaxant Associated symptoms: decreased ROM and swelling   Risk factors: obesity     Past Medical History  Diagnosis Date  . Asthma   . Hypertension   . Sleep apnea   . GERD (gastroesophageal reflux disease)   . Kidney stones     Past Surgical History  Procedure Laterality Date  . Gyn surgery    . Parathyroid exploration    . Ovary surgery      Family History  Problem Relation Age of Onset  . Diabetes Mother   . Heart failure Father     History    Substance Use Topics  . Smoking status: Never Smoker   . Smokeless tobacco: Not on file  . Alcohol Use: No    OB History   Grav Para Term Preterm Abortions TAB SAB Ect Mult Living                  Review of Systems  All other systems reviewed and are negative.    Allergies  Doxycycline; Penicillins; and Sulfonamide derivatives  Home Medications   Current Outpatient Rx  Name  Route  Sig  Dispense  Refill  . potassium citrate (UROCIT-K) 10 MEQ (1080 MG) SR tablet   Oral   Take 10 mEq by mouth 3 (three) times daily with meals.         Marland Kitchen albuterol (PROVENTIL) (2.5 MG/3ML) 0.083% nebulizer solution   Nebulization   Take 2.5 mg by nebulization every 6 (six) hours as needed.         Marland Kitchen aspirin-sod bicarb-citric acid (ALKA-SELTZER) 325 MG TBEF   Oral   Take 325 mg by mouth every 6 (six) hours as needed.         Marland Kitchen HYDROcodone-acetaminophen (NORCO/VICODIN) 5-325 MG per tablet  Take one by mouth at bedtime as needed for pain   10 tablet   0   . lisinopril (PRINIVIL,ZESTRIL) 10 MG tablet   Oral   Take 10 mg by mouth daily.         . mometasone (NASONEX) 50 MCG/ACT nasal spray   Nasal   Place 2 sprays into the nose daily.           BP 143/81  Pulse 82  Temp(Src) 97.9 F (36.6 C) (Oral)  Resp 18  Ht 5\' 1"  (1.549 m)  Wt 380 lb (172.367 kg)  BMI 71.84 kg/m2  SpO2 97%  Physical Exam  Nursing note and vitals reviewed. Constitutional: She is oriented to person, place, and time. She appears well-developed and well-nourished.  Patient is morbidly obese (BMI 71.8)  HENT:  Head: Normocephalic.  Eyes: Conjunctivae are normal. Pupils are equal, round, and reactive to light.  Musculoskeletal: She exhibits tenderness.       Right knee: She exhibits normal range of motion, no swelling, no effusion, no ecchymosis, no deformity, no erythema and normal alignment. Tenderness found. Medial joint line and patellar tendon tenderness noted.       Legs: Right knee is  difficult to examine adequately because of patient's obesity.  There is tenderness over the patella and medial joint line.  McMurray test is probably positive.  Joint appears stable.  Distal neurovascular function is intact.   Neurological: She is alert and oriented to person, place, and time.  Skin: Skin is warm and dry. No rash noted.    ED Course  Procedures  none   Dg Knee Complete 4 Views Right  06/06/2012  *RADIOLOGY REPORT*  Clinical Data: Right knee pain, no known injury  RIGHT KNEE - COMPLETE 4+ VIEW  Comparison: None.  Findings: No fracture or dislocation is seen.  Moderate to severe tricompartmental degenerative changes, most prominent in the medial and patellofemoral compartments.  No suprapatellar knee joint effusion.  IMPRESSION: No fracture or dislocation is seen.  Moderate to severe degenerative changes.   Original Report Authenticated By: Charline Bills, M.D.      1. Degenerative joint disease of knee, right       MDM  Dispensed crutches.  Rx for Lortab at bedtime. May increase Relafen (Nabumetone) to two 500mg  tabs twice daily for 3 to 5 days (take with food).  Use crutches until follow-up by Dr. Rodney Langton. Patient will benefit from long term goal of weight loss.        Lattie Haw, MD 06/06/12 [phone]

## 2012-06-15 ENCOUNTER — Institutional Professional Consult (permissible substitution): Payer: BC Managed Care – PPO | Admitting: Sports Medicine

## 2012-09-11 ENCOUNTER — Emergency Department (HOSPITAL_BASED_OUTPATIENT_CLINIC_OR_DEPARTMENT_OTHER)
Admission: EM | Admit: 2012-09-11 | Discharge: 2012-09-11 | Disposition: A | Discharge status: Home / Self Care | Payer: BC Managed Care – PPO | Attending: Emergency Medicine | Admitting: Emergency Medicine

## 2012-09-11 ENCOUNTER — Encounter (HOSPITAL_BASED_OUTPATIENT_CLINIC_OR_DEPARTMENT_OTHER): Payer: Self-pay | Admitting: *Deleted

## 2012-09-11 DIAGNOSIS — J45909 Unspecified asthma, uncomplicated: Secondary | ICD-10-CM | POA: Insufficient documentation

## 2012-09-11 DIAGNOSIS — I10 Essential (primary) hypertension: Secondary | ICD-10-CM | POA: Insufficient documentation

## 2012-09-11 DIAGNOSIS — G4730 Sleep apnea, unspecified: Secondary | ICD-10-CM | POA: Insufficient documentation

## 2012-09-11 DIAGNOSIS — R21 Rash and other nonspecific skin eruption: Secondary | ICD-10-CM

## 2012-09-11 DIAGNOSIS — Z880 Allergy status to penicillin: Secondary | ICD-10-CM | POA: Insufficient documentation

## 2012-09-11 DIAGNOSIS — R109 Unspecified abdominal pain: Secondary | ICD-10-CM | POA: Insufficient documentation

## 2012-09-11 DIAGNOSIS — Z79899 Other long term (current) drug therapy: Secondary | ICD-10-CM | POA: Insufficient documentation

## 2012-09-11 DIAGNOSIS — Z9889 Other specified postprocedural states: Secondary | ICD-10-CM | POA: Insufficient documentation

## 2012-09-11 DIAGNOSIS — R319 Hematuria, unspecified: Secondary | ICD-10-CM | POA: Insufficient documentation

## 2012-09-11 DIAGNOSIS — Z8719 Personal history of other diseases of the digestive system: Secondary | ICD-10-CM | POA: Insufficient documentation

## 2012-09-11 DIAGNOSIS — Z87442 Personal history of urinary calculi: Secondary | ICD-10-CM | POA: Insufficient documentation

## 2012-09-11 DIAGNOSIS — Z9079 Acquired absence of other genital organ(s): Secondary | ICD-10-CM | POA: Insufficient documentation

## 2012-09-11 LAB — LIPASE, BLOOD: Lipase: 18 U/L (ref 11–59)

## 2012-09-11 LAB — CBC WITH DIFFERENTIAL/PLATELET
Basophils Absolute: 0 10*3/uL (ref 0.0–0.1)
Basophils Relative: 0 % (ref 0–1)
HCT: 39.8 % (ref 36.0–46.0)
Hemoglobin: 13.5 g/dL (ref 12.0–15.0)
Lymphs Abs: 1.4 10*3/uL (ref 0.7–4.0)
MCHC: 33.9 g/dL (ref 30.0–36.0)
Monocytes Absolute: 0.8 10*3/uL (ref 0.1–1.0)
Neutrophils Relative %: 72 % (ref 43–77)
Platelets: 293 10*3/uL (ref 150–400)
RBC: 4.81 MIL/uL (ref 3.87–5.11)
RDW: 14.5 % (ref 11.5–15.5)

## 2012-09-11 LAB — COMPREHENSIVE METABOLIC PANEL
Alkaline Phosphatase: 84 U/L (ref 39–117)
CO2: 27 mEq/L (ref 19–32)
Calcium: 10.1 mg/dL (ref 8.4–10.5)
Chloride: 101 mEq/L (ref 96–112)
Creatinine, Ser: 1 mg/dL (ref 0.50–1.10)
Potassium: 4.4 mEq/L (ref 3.5–5.1)
Sodium: 138 mEq/L (ref 135–145)
Total Bilirubin: 0.2 mg/dL — ABNORMAL LOW (ref 0.3–1.2)
Total Protein: 7.2 g/dL (ref 6.0–8.3)

## 2012-09-11 LAB — URINALYSIS, ROUTINE W REFLEX MICROSCOPIC
Bilirubin Urine: NEGATIVE
Glucose, UA: NEGATIVE mg/dL
Ketones, ur: NEGATIVE mg/dL
Nitrite: NEGATIVE
Specific Gravity, Urine: 1.023 (ref 1.005–1.030)
pH: 6.5 (ref 5.0–8.0)

## 2012-09-11 LAB — URINE MICROSCOPIC-ADD ON

## 2012-09-11 MED ORDER — OXYCODONE-ACETAMINOPHEN 7.5-325 MG PO TABS: 1.0000 | ORAL_TABLET | ORAL | Status: DC | PRN

## 2012-09-11 MED ORDER — VALACYCLOVIR HCL 1 G PO TABS: 1000.0000 mg | ORAL_TABLET | Freq: Three times a day (TID) | ORAL | Status: AC

## 2012-09-11 NOTE — ED Provider Notes (Signed)
History  This chart was scribed for Nelia Shi, MD by Ardelia Mems, ED Scribe. This patient was seen in room MH08/MH08 and the patient's care was started at 5:05 PM.  CSN: [id number]  Arrival date & time 09/11/12  1534    Chief Complaint  Patient presents with  . Abdominal Pain    The history is provided by the patient. No language interpreter was used.   HPI Comments: Brittney Murray is a 52 y.o. female with a hx of kidney stones, HTN and morbid obesity who presents to the Emergency Department complaining of 8 days of constant, moderate, "burning" RUQ abdominal pain with some associated right flank pain. Pt states that she has an extensive hx of kidney stones but the associated pain has always been on her left side. She states that her kidney stones have been diagnosed by Dr. Beverely Pace in the past. 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 She denies hx of gallbladder or other abdominal surgery. She states that food does not make her pain better or worse. She states that lying supine at night worsens her pain. She denies fever, chills, dysuria, frequency, urgency, hematuria or any other symptoms. She denies alcohol use and denies any hx of smoking.   Past Medical History  Diagnosis Date  . Asthma   . Hypertension   . Sleep apnea   . GERD (gastroesophageal reflux disease)   . Kidney stones    Past Surgical History  Procedure Laterality Date  . Gyn surgery    . Parathyroid exploration    . Ovary surgery     Family History  Problem Relation Age of Onset  . Diabetes Mother   . Heart failure Father    History  Substance Use Topics  . Smoking status: Never Smoker   . Smokeless tobacco: Not on file  . Alcohol Use: No   OB History   Grav Para Term Preterm Abortions TAB SAB Ect Mult Living                 Review of Systems  Constitutional: Negative for fever and chills.  Gastrointestinal: Positive for abdominal pain.   Genitourinary: Positive for flank pain. Negative for dysuria, urgency, frequency and hematuria.   A complete 10 system review of systems was obtained and all systems are negative except as noted in the HPI and PMH.   Allergies  Bee venom; Doxycycline; Penicillins; Shrimp; and Sulfonamide derivatives  Home Medications   Current Outpatient Rx  Name  Route  Sig  Dispense  Refill  . albuterol (PROVENTIL) (2.5 MG/3ML) 0.083% nebulizer solution   Nebulization   Take 2.5 mg by nebulization every 6 (six) hours as needed.         Marland Kitchen aspirin-sod bicarb-citric acid (ALKA-SELTZER) 325 MG TBEF   Oral   Take 325 mg by mouth every 6 (six) hours as needed.         Marland Kitchen HYDROcodone-acetaminophen (NORCO/VICODIN) 5-325 MG per tablet      Take one by mouth at bedtime as needed for pain   10 tablet   0   . lisinopril (PRINIVIL,ZESTRIL) 10 MG tablet   Oral   Take 20 mg by mouth daily.          . mometasone (NASONEX) 50 MCG/ACT nasal spray   Nasal   Place 2 sprays into the nose daily.         . potassium citrate (UROCIT-K)  10 MEQ (1080 MG) SR tablet   Oral   Take 10 mEq by mouth 3 (three) times daily with meals.         . sertraline (ZOLOFT) 25 MG tablet   Oral   Take 25 mg by mouth daily.         Marland Kitchen oxyCODONE-acetaminophen (PERCOCET) 7.5-325 MG per tablet   Oral   Take 1 tablet by mouth every 4 (four) hours as needed for pain.   30 tablet   0   . valACYclovir (VALTREX) 1000 MG tablet   Oral   Take 1 tablet (1,000 mg total) by mouth 3 (three) times daily. Do not get filled and unless a rash develops over the next 72 hours.   21 tablet   0    Triage Vitals: BP 158/66  Pulse 98  Temp(Src) 98.2 F (36.8 C) (Oral)  Resp 20  Ht 5' (1.524 m)  Wt 380 lb (172.367 kg)  BMI 74.21 kg/m2  Physical Exam  Nursing note and vitals reviewed. Constitutional: She is oriented to person, place, and time. She appears well-developed and well-nourished. No distress.  HENT:  Head:  Normocephalic and atraumatic.  Eyes: Pupils are equal, round, and reactive to light.  Neck: Normal range of motion.  Cardiovascular: Normal rate and intact distal pulses.   Pulmonary/Chest: No respiratory distress.  Abdominal: Normal appearance. She exhibits no distension.  Musculoskeletal: Normal range of motion.  Neurological: She is alert and oriented to person, place, and time. No cranial nerve deficit.  Skin: Skin is warm and dry. Rash noted.     Psychiatric: She has a normal mood and affect. Her behavior is normal.    ED Course  Procedures (including critical care time)  DIAGNOSTIC STUDIES:   COORDINATION OF CARE: 5:10 PM- Pt advised of plan for diagnostic lab work and pt agrees.   Labs Reviewed  URINALYSIS, ROUTINE W REFLEX MICROSCOPIC - Abnormal; Notable for the following:    Hgb urine dipstick SMALL (*)    Leukocytes, UA TRACE (*)    All other components within normal limits  URINE MICROSCOPIC-ADD ON - Abnormal; Notable for the following:    Bacteria, UA FEW (*)    All other components within normal limits  COMPREHENSIVE METABOLIC PANEL - Abnormal; Notable for the following:    Glucose, Bld 113 (*)    Albumin 3.4 (*)    Total Bilirubin 0.2 (*)    GFR calc non Af Amer 65 (*)    GFR calc Af Amer 75 (*)    All other components within normal limits  CBC WITH DIFFERENTIAL  LIPASE, BLOOD   No results found.  1. Flank pain   2. Rash   3. Hematuria     MDM  In light of the possible early vesicular lesions noted I think a zoster is a possibility but that would not explain her hematuria.  In someone with history of stones a kidney stone may be possibility.  Because she's had numerous CTs in the past we elected at this time not to undergo imaging.  My plan is to prescribe pain medication along with some valacyclovir your.  She will not get the valacyclovir your filled unless she develops a rash in the next 72 hours.        I personally performed the services  described in this documentation, which was scribed in my presence. The recorded information has been reviewed and is accurate.    Nelia Shi, MD 09/22/12 [phone]

## 2012-09-11 NOTE — ED Notes (Signed)
Pt c/o RUQ pain x 1 week- some nausea denies vomiting

## 2013-06-06 ENCOUNTER — Emergency Department
Admission: EM | Admit: 2013-06-06 | Discharge: 2013-06-06 | Disposition: A | Discharge status: Home / Self Care | Payer: 59 | Source: Home / Self Care | Attending: Family Medicine | Admitting: Family Medicine

## 2013-06-06 ENCOUNTER — Encounter: Payer: Self-pay | Admitting: Emergency Medicine

## 2013-06-06 DIAGNOSIS — J45909 Unspecified asthma, uncomplicated: Secondary | ICD-10-CM

## 2013-06-06 DIAGNOSIS — J069 Acute upper respiratory infection, unspecified: Secondary | ICD-10-CM

## 2013-06-06 MED ORDER — AZITHROMYCIN 250 MG PO TABS: ORAL_TABLET | ORAL | Status: DC

## 2013-06-06 MED ORDER — IPRATROPIUM-ALBUTEROL 0.5-2.5 (3) MG/3ML IN SOLN
3.0000 mL | Freq: Once | RESPIRATORY_TRACT | Status: AC
Start: 2013-06-06 — End: 2013-06-06
  Administered 2013-06-06: 3 mL via RESPIRATORY_TRACT

## 2013-06-06 MED ORDER — IPRATROPIUM-ALBUTEROL 0.5-2.5 (3) MG/3ML IN SOLN: 3.0000 mL | RESPIRATORY_TRACT | Status: DC

## 2013-06-06 MED ORDER — PREDNISONE 50 MG PO TABS: ORAL_TABLET | ORAL | Status: DC

## 2013-06-06 MED ORDER — METHYLPREDNISOLONE SODIUM SUCC 125 MG IJ SOLR
125.0000 mg | Freq: Once | INTRAMUSCULAR | Status: AC
  Administered 2013-06-06: 125 mg via INTRAMUSCULAR

## 2013-06-06 NOTE — ED Provider Notes (Signed)
CSN: [id number]     Arrival date & time 06/06/13  1035 History   First MD Initiated Contact with Patient 06/06/13 1054     Chief Complaint  Patient presents with  . URI    HPI  URI Symptoms Onset: 1 week  Description: rhinorrhea, nasal congestion, cough, dyspnea, wheezing  Modifying factors:  Morbidly obese, baseline asthma, increased albuterol use   Symptoms Nasal discharge: yes Fever: no Sore throat: no Cough: yes Wheezing: yes Ear pain: no GI symptoms: no Sick contacts: no  Red Flags  Stiff neck: no Dyspnea: mild Rash: no Swallowing difficulty: no  Sinusitis Risk Factors Headache/face pain: no Double sickening: no tooth pain: no  Allergy Risk Factors Sneezing: no Itchy scratchy throat: no Seasonal symptoms: no  Flu Risk Factors Headache: no muscle aches: no severe fatigue: no   Past Medical History  Diagnosis Date  . Asthma   . Hypertension   . Sleep apnea   . GERD (gastroesophageal reflux disease)   . Kidney stones    Past Surgical History  Procedure Laterality Date  . Gyn surgery    . Parathyroid exploration    . Ovary surgery     Family History  Problem Relation Age of Onset  . Diabetes Mother   . Heart failure Father    History  Substance Use Topics  . Smoking status: Never Smoker   . Smokeless tobacco: Never Used  . Alcohol Use: No   OB History   Grav Para Term Preterm Abortions TAB SAB Ect Mult Living                 Review of Systems  All other systems reviewed and are negative.   Allergies  Bee venom; Doxycycline; Penicillins; Shrimp; and Sulfonamide derivatives  Home Medications   Current Outpatient Rx  Name  Route  Sig  Dispense  Refill  . lisinopril (PRINIVIL,ZESTRIL) 10 MG tablet   Oral   Take 20 mg by mouth daily.          . Pantoprazole Sodium (PROTONIX PO)   Oral   Take by mouth.         . potassium citrate (UROCIT-K) 10 MEQ (1080 MG) SR tablet   Oral   Take 10 mEq by mouth 3 (three) times daily  with meals.         Marland Kitchen. albuterol (PROVENTIL) (2.5 MG/3ML) 0.083% nebulizer solution   Nebulization   Take 2.5 mg by nebulization every 6 (six) hours as needed.         Marland Kitchen. aspirin-sod bicarb-citric acid (ALKA-SELTZER) 325 MG TBEF   Oral   Take 325 mg by mouth every 6 (six) hours as needed.         Marland Kitchen. azithromycin (ZITHROMAX) 250 MG tablet      Take 2 tabs PO x 1 dose, then 1 tab PO QD x 4 days   6 tablet   0   . mometasone (NASONEX) 50 MCG/ACT nasal spray   Nasal   Place 2 sprays into the nose daily.         . predniSONE (DELTASONE) 50 MG tablet      1 tab daily x 5 days   5 tablet   0   . sertraline (ZOLOFT) 25 MG tablet   Oral   Take 25 mg by mouth daily.          BP 121/83  Pulse 100  Temp(Src) 98.3 F (36.8 C) (Oral)  Resp 24  Ht 5'  1" (1.549 m)  Wt 436 lb (197.768 kg)  BMI 82.42 kg/m2  SpO2 98% Physical Exam  Constitutional:  Morbidly obese   HENT:  Head: Normocephalic and atraumatic.  Right Ear: External ear normal.  Left Ear: External ear normal.  +nasal erythema, rhinorrhea bilaterally, + post oropharyngeal erythema    Eyes: Conjunctivae are normal. Pupils are equal, round, and reactive to light.  Neck: Normal range of motion. Neck supple.  Cardiovascular: Normal rate and regular rhythm.   Pulmonary/Chest: Effort normal. She has wheezes.  Abdominal: Soft.  Musculoskeletal: Normal range of motion.  Neurological: She is alert.  Skin: Skin is warm.    ED Course  Procedures (including critical care time) Labs Review Labs Reviewed - No data to display Imaging Review No results found.   MDM   1. ASTHMA   2. URI (upper respiratory infection)    solumedrol 125mg  Im x1 Duoneb x 1 Improved aeration s/p duoneb Will place on course of prednisone and zpak for treatment  Discussed infectious and resp red flags at length.  Follow up as needed.    The patient and/or caregiver has been counseled thoroughly with regard to treatment plan  and/or medications prescribed including dosage, schedule, interactions, rationale for use, and possible side effects and they verbalize understanding. Diagnoses and expected course of recovery discussed and will return if not improved as expected or if the condition worsens. Patient and/or caregiver verbalized understanding.          Doree AlbeeSteven Nicholad Kautzman, MD 06/06/13 1101

## 2013-06-07 ENCOUNTER — Telehealth: Payer: Self-pay | Admitting: *Deleted

## 2013-06-08 ENCOUNTER — Encounter: Payer: Self-pay | Admitting: Emergency Medicine

## 2013-06-08 ENCOUNTER — Emergency Department (INDEPENDENT_AMBULATORY_CARE_PROVIDER_SITE_OTHER)
Admission: EM | Admit: 2013-06-08 | Discharge: 2013-06-08 | Disposition: A | Discharge status: Home / Self Care | Payer: 59 | Source: Home / Self Care | Attending: Family Medicine | Admitting: Family Medicine

## 2013-06-08 DIAGNOSIS — J069 Acute upper respiratory infection, unspecified: Secondary | ICD-10-CM

## 2013-06-08 DIAGNOSIS — J45901 Unspecified asthma with (acute) exacerbation: Secondary | ICD-10-CM

## 2013-06-08 MED ORDER — IPRATROPIUM-ALBUTEROL 0.5-2.5 (3) MG/3ML IN SOLN
3.0000 mL | RESPIRATORY_TRACT | Status: DC
  Administered 2013-06-08: 3 mL via RESPIRATORY_TRACT

## 2013-06-08 NOTE — ED Provider Notes (Signed)
CSN: [id number]     Arrival date & time 06/08/13  1059 History   First MD Initiated Contact with Patient 06/08/13 1110     Chief Complaint  Patient presents with  . Shortness of Breath     HPI Comments: Patient reports general improvement in her URI.  [facility]  The history is provided by the patient.    Past Medical History  Diagnosis Date  . Asthma   . Hypertension   . Sleep apnea   . GERD (gastroesophageal reflux disease)   . Kidney stones    Past Surgical History  Procedure Laterality Date  . Gyn surgery    . Parathyroid exploration    . Ovary surgery     Family History  Problem Relation Age of Onset  . Diabetes Mother   . Heart failure Father    History  Substance Use Topics  . Smoking status: Never Smoker   . Smokeless tobacco: Never Used  . Alcohol Use: No   OB History   Grav Para Term Preterm Abortions TAB SAB Ect Mult Living                 Review of Systems Recurrent wheezing and shortness of breath  Allergies  Bee venom; Doxycycline; Penicillins; Shrimp; and Sulfonamide derivatives  Home Medications   Prior to Admission medications   Medication Sig Start Date End Date Taking? Authorizing Provider  albuterol (PROVENTIL) (2.5 MG/3ML) 0.083% nebulizer solution Take 2.5 mg by nebulization every 6 (six) hours as needed.    Historical Provider, MD  aspirin-sod bicarb-citric acid (ALKA-SELTZER) 325 MG TBEF Take 325 mg by mouth every 6 (six) hours as needed.    Historical Provider, MD  azithromycin (ZITHROMAX) 250 MG tablet Take 2 tabs PO x 1 dose, then 1 tab PO QD x 4 days 06/06/13   Doree AlbeeSteven Newton, MD  lisinopril (PRINIVIL,ZESTRIL) 10 MG tablet Take 20 mg by mouth daily.     Historical Provider, MD  mometasone (NASONEX) 50 MCG/ACT nasal spray Place 2 sprays into the nose daily.    Historical Provider, MD  Pantoprazole Sodium (PROTONIX PO) Take by mouth.    Historical Provider, MD  potassium citrate (UROCIT-K) 10 MEQ (1080 MG) SR  tablet Take 10 mEq by mouth 3 (three) times daily with meals.    Historical Provider, MD  predniSONE (DELTASONE) 50 MG tablet 1 tab daily x 5 days 06/06/13   Doree AlbeeSteven Newton, MD  sertraline (ZOLOFT) 25 MG tablet Take 25 mg by mouth daily.    Historical Provider, MD   BP 175/99  Pulse 89  Temp(Src) 98 F (36.7 C) (Oral)  Resp 20  SpO2 98% Physical Exam Nursing notes and Vital Signs reviewed. Patient alert and in no acute distress     ED Course  Procedures             MDM   1. Acute upper respiratory infections of unspecified site   2. Asthma with acute exacerbation    DuoNeb nebulizer treatment administered. Continue present medications. Followup with Family Doctor if not improved in one week.      Lattie HawStephen A Ilona Colley, MD 06/08/13 [phone]

## 2013-06-08 NOTE — ED Notes (Addendum)
The pt is here today for a nurse visit for a nebulizer treatment of Duoneb. ok'ed per dr Orson AloeHenderson.

## 2013-06-08 NOTE — Discharge Instructions (Signed)
Continue present medications. 

## 2013-09-06 ENCOUNTER — Telehealth: Payer: Self-pay | Admitting: *Deleted

## 2013-09-06 ENCOUNTER — Encounter: Payer: Self-pay | Admitting: Emergency Medicine

## 2013-09-06 ENCOUNTER — Emergency Department
Admission: EM | Admit: 2013-09-06 | Discharge: 2013-09-06 | Disposition: A | Discharge status: Home / Self Care | Payer: BC Managed Care – PPO | Source: Home / Self Care | Attending: Emergency Medicine | Admitting: Emergency Medicine

## 2013-09-06 DIAGNOSIS — L03116 Cellulitis of left lower limb: Secondary | ICD-10-CM

## 2013-09-06 DIAGNOSIS — L03119 Cellulitis of unspecified part of limb: Secondary | ICD-10-CM

## 2013-09-06 DIAGNOSIS — L02419 Cutaneous abscess of limb, unspecified: Secondary | ICD-10-CM

## 2013-09-06 MED ORDER — TRAMADOL-ACETAMINOPHEN 37.5-325 MG PO TABS: ORAL_TABLET | ORAL | Status: DC

## 2013-09-06 MED ORDER — CEPHALEXIN 500 MG PO CAPS: 500.0000 mg | ORAL_CAPSULE | Freq: Three times a day (TID) | ORAL | Status: DC

## 2013-09-06 NOTE — ED Notes (Signed)
Cellulitis left lower leg, bilateral legs swelling x 2 weeks

## 2013-09-06 NOTE — ED Notes (Unsigned)
pt called asking if her husband also needed an ABT. she reports that he rubs her legs every night and he has been experiencing some nausea and dizziness. she didn't know if the cellulitis was contagious. she denies that he any rash or redness to his skin. Advised her per Dr Orson AloeHenderson that the Cellulitis was not contagious and that if his s/s continues he should f/u with his PCP.

## 2013-09-06 NOTE — ED Provider Notes (Signed)
CSN: [id number]     Arrival date & time 09/06/13  4098 History   First MD Initiated Contact with Patient 09/06/13 0919     Chief Complaint  Patient presents with  . Cellulitis    HPI Worsening swelling, moderate sharp and dull pain, redness, of left anterior lower leg, x2 weeks.  It started when she scratched a sore pimple left anterior lower leg, and the wound became open with slight light yellow drainage. No bleeding or fluctuance. No calf tenderness. Denies fever or chills nausea or vomiting or focal neurologic symptoms. Denies acute wheezing or change in her usual dyspnea on exertion. Denies chest pain. She has chronic bilateral leg edema from morbid obesity. The leg edema bilaterally he may have worsened somewhat the past week or 2 with the hot and humid weather.  Reviewed past medical history below, including morbid obesity. And chronic leg edema.--The furosemide has helped leg edema somewhat. Past Medical History  Diagnosis Date  . Asthma   . Hypertension   . Sleep apnea   . GERD (gastroesophageal reflux disease)   . Kidney stones    Past Surgical History  Procedure Laterality Date  . Gyn surgery    . Parathyroid exploration    . Ovary surgery     Family History  Problem Relation Age of Onset  . Diabetes Mother   . Heart failure Father    History  Substance Use Topics  . Smoking status: Never Smoker   . Smokeless tobacco: Never Used  . Alcohol Use: No   OB History   Grav Para Term Preterm Abortions TAB SAB Ect Mult Living                 Review of Systems  All other systems reviewed and are negative.   Allergies  Bee venom; Doxycycline; Penicillins; Shrimp; and Sulfonamide derivatives  Home Medications   Prior to Admission medications   Medication Sig Start Date End Date Taking? Authorizing Provider  albuterol (PROVENTIL) (2.5 MG/3ML) 0.083% nebulizer solution Take 2.5 mg by nebulization every 6 (six) hours as needed.    Historical Provider, MD    aspirin-sod bicarb-citric acid (ALKA-SELTZER) 325 MG TBEF Take 325 mg by mouth every 6 (six) hours as needed.    Historical Provider, MD  cephALEXin (KEFLEX) 500 MG capsule Take 1 capsule (500 mg total) by mouth 3 (three) times daily. X 10 days 09/06/13   Lajean Manes, MD  lisinopril (PRINIVIL,ZESTRIL) 10 MG tablet Take 20 mg by mouth daily.     Historical Provider, MD  mometasone (NASONEX) 50 MCG/ACT nasal spray Place 2 sprays into the nose daily.    Historical Provider, MD  Pantoprazole Sodium (PROTONIX PO) Take by mouth.    Historical Provider, MD  potassium citrate (UROCIT-K) 10 MEQ (1080 MG) SR tablet Take 10 mEq by mouth 3 (three) times daily with meals.    Historical Provider, MD  sertraline (ZOLOFT) 25 MG tablet Take 25 mg by mouth daily.    Historical Provider, MD  traMADol-acetaminophen (ULTRACET) 37.5-325 MG per tablet 1 or 2 every 4-6 hours as needed for moderate-severe pain.  Caution: May cause drowsiness 09/06/13   Lajean Manes, MD   BP 178/84  Pulse 106  Temp(Src) 97.5 F (36.4 C) (Oral)  Ht 5' (1.524 m)  Wt 437 lb (198.222 kg)  BMI 85.35 kg/m2  SpO2 95% Physical Exam  Nursing note and vitals reviewed. Constitutional: She is oriented to person, place, and time. She appears well-developed and well-nourished. No distress.  Morbidly obese. No acute distress. Pleasant, cooperative.  HENT:  Head: Normocephalic and atraumatic.  Mouth/Throat: Oropharynx is clear and moist.  Eyes: Conjunctivae and EOM are normal. Pupils are equal, round, and reactive to light. No scleral icterus.  Neck: Normal range of motion. No JVD present.  Cardiovascular: Normal rate, regular rhythm and normal heart sounds.  Exam reveals no gallop and no friction rub.   No murmur heard. Pulmonary/Chest: Effort normal. No respiratory distress. She has no wheezes. She has no rales.  Abdominal: She exhibits no distension. There is no tenderness.  Musculoskeletal: Normal range of motion.       Left lower leg:  She exhibits edema (1+ nonpitting edema bilaterally lower extremities). She exhibits no bony tenderness.       Legs: Skin of left anterior lower leg, 4 x 4 millimeter superficial open wound with slight yellow serous drainage. No fluctuance or bleeding. There is surrounding 3 x 3 cm area of induration and warmth.  No calf tenderness or popliteal tenderness. No cords. No sign of DVT. Homans sign negative.  Distal neurovascular and capillary refill intact bilaterally.  Neurological: She is alert and oriented to person, place, and time.  Skin: Skin is warm. She is not diaphoretic.  Psychiatric: She has a normal mood and affect.    ED Course  Procedures (including critical care time) Labs Review Labs Reviewed  WOUND CULTURE    Imaging Review No results found.   MDM   1. Cellulitis of leg, left    Clinically, no sign of DVT or abscess. Her chronic morbid obesity and chronic leg edema from this is a factor, but no evidence of CHF or other generalized edema.  Treatment options discussed, as well as risks, benefits, alternatives. Patient voiced understanding and agreement with the following plans: Wound culture sent. I carefully reviewed her listed  drug allergies.--Including sulfa, penicillin, and doxycycline. She states that she's not sure what the reactions were but some of them caused swelling.--I questioned her about Keflex, and she states she is taking Keflex in the past without side effects. Therefore, prescribed Keflex 500 mg 3 times a day x10 days, awaiting the wound culture. Today, we cleaned the wound with Hibiclens and dressed with Polysporin and nonstick dressing. Wrapped with Coban.  Other wound care discussed. Elevate legs periodically and take furosemide as diuretic as prescribed by PCP. Advised she needs close followup with PCP within 2-3 days to reevaluate and possibly recheck potassium and electrolytes another blood test.  Advised her to discuss with PCP other  treatment for chronic leg edema. Advised her to lose weight as her morbid obesity is a contributing factor.  For pain relief, may use Tylenol or ibuprofen for mild to moderate pain. She requested a prescription for pain meds, and I prescribed small amount of Ultracet, but precautions discussed.  I urged her to followup with her PCP in 2-3 days to reevaluate as well as reevaluate leg edema and her other multiple chronic problems.  Precautions discussed. Red flags discussed. Questions invited and answered. Patient voiced understanding and agreement.     Lajean Manesavid Massey, MD 09/06/13 1017

## 2013-09-09 LAB — WOUND CULTURE
Gram Stain: NONE SEEN
Gram Stain: NONE SEEN
Gram Stain: NONE SEEN
Organism ID, Bacteria: NO GROWTH

## 2013-10-18 ENCOUNTER — Encounter: Payer: Self-pay | Admitting: Emergency Medicine

## 2013-10-18 ENCOUNTER — Emergency Department (INDEPENDENT_AMBULATORY_CARE_PROVIDER_SITE_OTHER)
Admission: EM | Admit: 2013-10-18 | Discharge: 2013-10-18 | Disposition: A | Discharge status: Home / Self Care | Payer: BC Managed Care – PPO | Source: Home / Self Care | Attending: Family Medicine | Admitting: Family Medicine

## 2013-10-18 DIAGNOSIS — R1032 Left lower quadrant pain: Secondary | ICD-10-CM

## 2013-10-18 LAB — POCT URINALYSIS DIP (MANUAL ENTRY)
GLUCOSE UA: NEGATIVE
LEUKOCYTES UA: NEGATIVE
Nitrite, UA: POSITIVE
PH UA: 5 (ref 5–8)
Protein Ur, POC: >=300
Spec Grav, UA: 1.025 (ref 1.005–1.03)
UROBILINOGEN UA: 1 (ref 0–1)

## 2013-10-18 NOTE — ED Provider Notes (Signed)
Brittney Murray is a 53 y.o. female who presents to Urgent Care today for abdominal pain. Patient is a 40 history of worsening severe left lower corner and abdominal pain. This is associated with either vaginal bleeding or blood in the urine. The patient is not quite sure. The bleeding has worsened and now she is passing blood clots. She notes a long history of left-sided kidney stones and hydronephrosis but notes the pain is not consistent with prior episodes of kidney stones. She denies any fevers or chills. She notes decrease in appetite but denies any constipation or diarrhea. She denies any vomiting. The last menstrual period she had was in 2008. She's tried some tramadol which helps some.   Past Medical History  Diagnosis Date  . Asthma   . Hypertension   . Sleep apnea   . GERD (gastroesophageal reflux disease)   . Kidney stones    History  Substance Use Topics  . Smoking status: Never Smoker   . Smokeless tobacco: Never Used  . Alcohol Use: No   ROS as above Medications: No current facility-administered medications for this encounter.   Current Outpatient Prescriptions  Medication Sig Dispense Refill  . spironolactone (ALDACTONE) 100 MG tablet Take 100 mg by mouth daily.      Marland Kitchen albuterol (PROVENTIL) (2.5 MG/3ML) 0.083% nebulizer solution Take 2.5 mg by nebulization every 6 (six) hours as needed.      Marland Kitchen aspirin-sod bicarb-citric acid (ALKA-SELTZER) 325 MG TBEF Take 325 mg by mouth every 6 (six) hours as needed.      . cephALEXin (KEFLEX) 500 MG capsule Take 1 capsule (500 mg total) by mouth 3 (three) times daily. X 10 days  30 capsule  0  . lisinopril (PRINIVIL,ZESTRIL) 10 MG tablet Take 20 mg by mouth daily.       . mometasone (NASONEX) 50 MCG/ACT nasal spray Place 2 sprays into the nose daily.      . Pantoprazole Sodium (PROTONIX PO) Take by mouth.      . potassium citrate (UROCIT-K) 10 MEQ (1080 MG) SR tablet Take 10 mEq by mouth 3 (three) times daily with meals.      .  sertraline (ZOLOFT) 25 MG tablet Take 25 mg by mouth daily.      . traMADol-acetaminophen (ULTRACET) 37.5-325 MG per tablet 1 or 2 every 4-6 hours as needed for moderate-severe pain.  Caution: May cause drowsiness  12 tablet  0    Exam:  BP 153/87  Pulse 110  Temp(Src) 98.9 F (37.2 C) (Oral)  Resp 18  Ht 5' (1.524 m)  Wt 430 lb (195.047 kg)  BMI 83.98 kg/m2  SpO2 97% Gen: Well NAD morbidly obese  HEENT: EOMI,  MMM Lungs: Normal work of breathing. CTABL Heart: RRR no MRG Abd: NABS, Soft. Obese, tender to palpation left lower quadrant with guarding. No significant CV angle tenderness to percussion Exts: Brisk capillary refill, warm and well perfused.   Results for orders placed during the hospital encounter of 10/18/13 (from the past 24 hour(s))  POCT URINALYSIS DIP (MANUAL ENTRY)     Status: None   Collection Time    10/18/13 10:53 AM      Result Value Ref Range   Color, UA orange     Clarity, UA cloudy     Glucose, UA neg     Bilirubin, UA small     Bilirubin, UA trace (5)     Spec Grav, UA 1.025  1.005 - 1.03   Blood, UA large  pH, UA 5.0  5 - 8   Protein Ur, POC >=300     Urobilinogen, UA 1.0  0 - 1   Nitrite, UA Positive     Leukocytes, UA Negative     No results found.  Assessment and Plan: 53 y.o. female with significant worsening four-day history of left lower quadrant abdominal pain with either blood in the urine or vaginal bleeding. This is concerning for multiple potentially serious etiologies. A kidney stone is a possibility however she notes that her current pain is not consistent with prior episodes of kidney stone. Plan to transfer to the emergency department for further evaluation and management of this issue.   Discussed warning signs or symptoms. Please see discharge instructions. Patient expresses understanding.   This note was created using Conservation officer, historic buildings. Any transcription errors are unintended.    Rodolph Bong,  MD 10/18/13 [phone]

## 2013-10-18 NOTE — ED Notes (Signed)
Reports hematuria noticed 5 days ago and wondered if renal calculus; progressed to left lower abdominal pain and some lower back pain; some nausea; fatigue; headache. No OTC past 4 hours.

## 2013-10-18 NOTE — Discharge Instructions (Signed)
Thank you for coming in today. Go directly to the emergency department  Abdominal Pain, Women Abdominal (stomach, pelvic, or belly) pain can be caused by many things. It is important to tell your doctor:  The location of the pain.  Does it come and go or is it present all the time?  Are there things that start the pain (eating certain foods, exercise)?  Are there other symptoms associated with the pain (fever, nausea, vomiting, diarrhea)? All of this is helpful to know when trying to find the cause of the pain. CAUSES   Stomach: virus or bacteria infection, or ulcer.  Intestine: appendicitis (inflamed appendix), regional ileitis (Crohn's disease), ulcerative colitis (inflamed colon), irritable bowel syndrome, diverticulitis (inflamed diverticulum of the colon), or cancer of the stomach or intestine.  Gallbladder disease or stones in the gallbladder.  Kidney disease, kidney stones, or infection.  Pancreas infection or cancer.  Fibromyalgia (pain disorder).  Diseases of the female organs:  Uterus: fibroid (non-cancerous) tumors or infection.  Fallopian tubes: infection or tubal pregnancy.  Ovary: cysts or tumors.  Pelvic adhesions (scar tissue).  Endometriosis (uterus lining tissue growing in the pelvis and on the pelvic organs).  Pelvic congestion syndrome (female organs filling up with blood just before the menstrual period).  Pain with the menstrual period.  Pain with ovulation (producing an egg).  Pain with an IUD (intrauterine device, birth control) in the uterus.  Cancer of the female organs.  Functional pain (pain not caused by a disease, may improve without treatment).  Psychological pain.  Depression. DIAGNOSIS  Your doctor will decide the seriousness of your pain by doing an examination.  Blood tests.  X-rays.  Ultrasound.  CT scan (computed tomography, special type of X-ray).  MRI (magnetic resonance imaging).  Cultures, for  infection.  Barium enema (dye inserted in the large intestine, to better view it with X-rays).  Colonoscopy (looking in intestine with a lighted tube).  Laparoscopy (minor surgery, looking in abdomen with a lighted tube).  Major abdominal exploratory surgery (looking in abdomen with a large incision). TREATMENT  The treatment will depend on the cause of the pain.   Many cases can be observed and treated at home.  Over-the-counter medicines recommended by your caregiver.  Prescription medicine.  Antibiotics, for infection.  Birth control pills, for painful periods or for ovulation pain.  Hormone treatment, for endometriosis.  Nerve blocking injections.  Physical therapy.  Antidepressants.  Counseling with a psychologist or psychiatrist.  Minor or major surgery. HOME CARE INSTRUCTIONS   Do not take laxatives, unless directed by your caregiver.  Take over-the-counter pain medicine only if ordered by your caregiver. Do not take aspirin because it can cause an upset stomach or bleeding.  Try a clear liquid diet (broth or water) as ordered by your caregiver. Slowly move to a bland diet, as tolerated, if the pain is related to the stomach or intestine.  Have a thermometer and take your temperature several times a day, and record it.  Bed rest and sleep, if it helps the pain.  Avoid sexual intercourse, if it causes pain.  Avoid stressful situations.  Keep your follow-up appointments and tests, as your caregiver orders.  If the pain does not go away with medicine or surgery, you may try:  Acupuncture.  Relaxation exercises (yoga, meditation).  Group therapy.  Counseling. SEEK MEDICAL CARE IF:   You notice certain foods cause stomach pain.  Your home care treatment is not helping your pain.  You need stronger  pain medicine.  You want your IUD removed.  You feel faint or lightheaded.  You develop nausea and vomiting.  You develop a rash.  You are  having side effects or an allergy to your medicine. SEEK IMMEDIATE MEDICAL CARE IF:   Your pain does not go away or gets worse.  You have a fever.  Your pain is felt only in portions of the abdomen. The right side could possibly be appendicitis. The left lower portion of the abdomen could be colitis or diverticulitis.  You are passing blood in your stools (bright red or black tarry stools, with or without vomiting).  You have blood in your urine.  You develop chills, with or without a fever.  You pass out. MAKE SURE YOU:   Understand these instructions.  Will watch your condition.  Will get help right away if you are not doing well or get worse. Document Released: 12/08/2006 Document Revised: 06/27/2013 Document Reviewed: 12/28/2008 [facility] Patient Information 2015 [place], [place]. This information is not intended to replace advice given to you by your health care provider. Make sure you discuss any questions you have with your health care provider.

## 2013-10-19 LAB — URINE CULTURE
COLONY COUNT: NO GROWTH
Organism ID, Bacteria: NO GROWTH

## 2013-10-20 ENCOUNTER — Telehealth: Payer: Self-pay | Admitting: Emergency Medicine

## 2013-11-29 DIAGNOSIS — Z90721 Acquired absence of ovaries, unilateral: Secondary | ICD-10-CM | POA: Insufficient documentation

## 2013-11-29 DIAGNOSIS — K219 Gastro-esophageal reflux disease without esophagitis: Secondary | ICD-10-CM | POA: Insufficient documentation

## 2013-11-29 DIAGNOSIS — M1990 Unspecified osteoarthritis, unspecified site: Secondary | ICD-10-CM | POA: Insufficient documentation

## 2013-11-29 DIAGNOSIS — G4733 Obstructive sleep apnea (adult) (pediatric): Secondary | ICD-10-CM | POA: Insufficient documentation

## 2013-11-29 DIAGNOSIS — M545 Low back pain, unspecified: Secondary | ICD-10-CM | POA: Insufficient documentation

## 2014-04-24 ENCOUNTER — Encounter: Payer: Self-pay | Admitting: *Deleted

## 2014-04-24 ENCOUNTER — Emergency Department (INDEPENDENT_AMBULATORY_CARE_PROVIDER_SITE_OTHER)
Admission: EM | Admit: 2014-04-24 | Discharge: 2014-04-24 | Disposition: A | Discharge status: Home / Self Care | Payer: BLUE CROSS/BLUE SHIELD | Source: Home / Self Care | Attending: Family Medicine | Admitting: Family Medicine

## 2014-04-24 DIAGNOSIS — J069 Acute upper respiratory infection, unspecified: Secondary | ICD-10-CM | POA: Diagnosis not present

## 2014-04-24 DIAGNOSIS — J9801 Acute bronchospasm: Secondary | ICD-10-CM

## 2014-04-24 DIAGNOSIS — B9789 Other viral agents as the cause of diseases classified elsewhere: Principal | ICD-10-CM

## 2014-04-24 MED ORDER — ALBUTEROL SULFATE HFA 108 (90 BASE) MCG/ACT IN AERS: 2.0000 | INHALATION_SPRAY | RESPIRATORY_TRACT | Status: DC | PRN

## 2014-04-24 MED ORDER — AZITHROMYCIN 250 MG PO TABS: ORAL_TABLET | ORAL | Status: DC

## 2014-04-24 MED ORDER — PREDNISONE 20 MG PO TABS: 20.0000 mg | ORAL_TABLET | Freq: Two times a day (BID) | ORAL | Status: DC

## 2014-04-24 NOTE — Discharge Instructions (Signed)
Take plain guaifenesin (1200mg  extended release tabs such as Mucinex) twice daily, with plenty of water, for cough and congestion.  Get adequate rest.   May use Afrin nasal spray (or generic oxymetazoline) twice daily for about 5 days.  Also recommend using saline nasal spray several times daily and saline nasal irrigation (AYR is a common brand).  Use Nasonex nasal spray each morning after using Afrin nasal spray and saline nasal irrigation. Continue albuterol inhaler as needed. May take Delsym Cough Suppressant at bedtime for nighttime cough.  Try warm salt water gargles for sore throat.  Stop all antihistamines for now, and other non-prescription cough/cold preparations. Follow-up with family doctor if not improving about10 days.

## 2014-04-24 NOTE — ED Notes (Signed)
Brittney KidneyDebra c/o 4 days of ear pain, dry cough, runny nose, congestion, HA and SOB. Using inhaler without much relief and Alkaseltzer cold/flu and dayquil.

## 2014-04-24 NOTE — ED Provider Notes (Signed)
CSN: [id number]     Arrival date & time 04/24/14  [id number] History   First MD Initiated Contact with Patient 04/24/14 [phone]     Chief Complaint  Patient presents with  . Otalgia  . Cough     HPI Comments: Patient has had persistent nasal congestion for about one month.  Four days ago she developed increased sinus congestion, headache, right earache, myalgias, cough, chills and sore throat.  She has had increased shortness of breath and wheezing, improved with her albuterol inhaler. She has a past history of of pneumonia bout 1.5 years                                                                                                                                                                                                                                                                                                                                                                                                                                                                                       The history is provided by the patient.    Past Medical History  Diagnosis Date  . Asthma   . Hypertension   . Sleep apnea   .  GERD (gastroesophageal reflux disease)   . Kidney stones    Past Surgical History  Procedure Laterality Date  . Gyn surgery    . Parathyroid exploration    . Ovary surgery     Family History  Problem Relation Age of Onset  . Diabetes Mother   . Alzheimer's disease Mother   . Heart failure Father   . COPD Father    History  Substance Use Topics  . Smoking status: Never Smoker   . Smokeless tobacco: Never Used  . Alcohol Use: No   OB History    No data available     Review of Systems + sore throat + cough No pleuritic pain + wheezing + nasal congestion + post-nasal drainage No sinus pain/pressure No itchy/red eyes ? earache No hemoptysis + SOB No fever, + chills + nausea No vomiting No abdominal pain No diarrhea No urinary symptoms No skin  rash + fatigue + myalgias + headache Used OTC meds without relief  Allergies  Bee venom; Doxycycline; Penicillins; Shrimp; and Sulfonamide derivatives  Home Medications   Prior to Admission medications   Medication Sig Start Date End Date Taking? Authorizing Provider  albuterol (PROVENTIL) (2.5 MG/3ML) 0.083% nebulizer solution Take 2.5 mg by nebulization every 6 (six) hours as needed.   Yes Historical Provider, MD  lisinopril (PRINIVIL,ZESTRIL) 10 MG tablet Take 20 mg by mouth daily.    Yes Historical Provider, MD  mometasone (NASONEX) 50 MCG/ACT nasal spray Place 2 sprays into the nose daily.   Yes Historical Provider, MD  Pantoprazole Sodium (PROTONIX PO) Take by mouth.   Yes Historical Provider, MD  potassium citrate (UROCIT-K) 10 MEQ (1080 MG) SR tablet Take 10 mEq by mouth 3 (three) times daily with meals.   Yes Historical Provider, MD  spironolactone (ALDACTONE) 100 MG tablet Take 100 mg by mouth daily.   Yes Historical Provider, MD  albuterol (PROVENTIL HFA;VENTOLIN HFA) 108 (90 BASE) MCG/ACT inhaler Inhale 2 puffs into the lungs every 4 (four) hours as needed for wheezing or shortness of breath. 04/24/14   Lattie Haw, MD  azithromycin (ZITHROMAX Z-PAK) 250 MG tablet Take 2 tabs today; then begin one tab once daily for 4 more days. 04/24/14   Lattie Haw, MD  predniSONE (DELTASONE) 20 MG tablet Take 1 tablet (20 mg total) by mouth 2 (two) times daily. Take with food. 04/24/14   Lattie Haw, MD   BP 156/84 mmHg  Pulse 104  Temp(Src) 98.2 F (36.8 C) (Oral)  Resp 18  Wt 428 lb (194.14 kg)  SpO2 96% Physical Exam Nursing notes and Vital Signs reviewed. Appearance:  Patient appears stated age, and in no acute distress.  Patient is obese Eyes:  Pupils are equal, round, and reactive to light and accomodation.  Extraocular movement is intact.  Conjunctivae are not inflamed  Ears:  Canals normal.  Tympanic membranes normal.  Nose:  Mildly congested turbinates.  No sinus  tenderness.  Pharynx:  Normal Neck:  Supple. Tender enlarged posterior nodes are palpated bilaterally  Lungs:  Clear to auscultation.  Breath sounds are equal.  Chest:  Distinct tenderness to palpation over the mid-sternum.  Heart:  Regular rate and rhythm without murmurs, rubs, or gallops.  Abdomen:  Nontender without masses or hepatosplenomegaly.  Bowel sounds are present.  No CVA or flank tenderness.  Extremities:  No edema.  No calf tenderness Skin:  No rash present.    ED Course  Procedures  none  MDM  1. Viral URI with cough   2. Bronchospasm, acute    Begin Z-pack for atypical coverage.  Begin prednisone burst.  Refill albuterol inhaler. Take plain guaifenesin (  extended release tabs such as Mucinex) twice daily, with plenty of water, for cough and congestion.  Get adequate rest.   May use Afrin nasal spray (or generic oxymetazoline) twice daily for about 5 days.  Also recommend using saline nasal spray several times daily and saline nasal irrigation (AYR is a common brand).  Use Nasonex nasal spray each morning after using Afrin nasal spray and saline nasal irrigation. Continue albuterol inhaler as needed. May take Delsym Cough Suppressant at bedtime for nighttime cough.  Try warm salt water gargles for sore throat.  Stop all antihistamines for now, and other non-prescription cough/cold preparations. Follow-up with family doctor if not improving about10 days    Lattie Haw, MD 04/27/14 2105

## 2014-06-01 ENCOUNTER — Encounter: Payer: Self-pay | Admitting: *Deleted

## 2014-06-01 ENCOUNTER — Emergency Department: Payer: BLUE CROSS/BLUE SHIELD

## 2014-06-01 ENCOUNTER — Emergency Department
Admission: EM | Admit: 2014-06-01 | Discharge: 2014-06-01 | Disposition: A | Discharge status: Home / Self Care | Payer: BLUE CROSS/BLUE SHIELD | Source: Home / Self Care | Attending: Emergency Medicine | Admitting: Emergency Medicine

## 2014-06-01 DIAGNOSIS — M7989 Other specified soft tissue disorders: Secondary | ICD-10-CM

## 2014-06-01 DIAGNOSIS — M79604 Pain in right leg: Secondary | ICD-10-CM | POA: Diagnosis not present

## 2014-06-01 DIAGNOSIS — M79601 Pain in right arm: Secondary | ICD-10-CM

## 2014-06-01 DIAGNOSIS — M79661 Pain in right lower leg: Secondary | ICD-10-CM

## 2014-06-01 DIAGNOSIS — L03115 Cellulitis of right lower limb: Secondary | ICD-10-CM

## 2014-06-01 MED ORDER — TRAMADOL-ACETAMINOPHEN 37.5-325 MG PO TABS: ORAL_TABLET | ORAL | Status: DC

## 2014-06-01 MED ORDER — CEPHALEXIN 500 MG PO CAPS: 500.0000 mg | ORAL_CAPSULE | Freq: Three times a day (TID) | ORAL | Status: DC

## 2014-06-01 NOTE — ED Provider Notes (Addendum)
CSN: [id number]     Arrival date & time 06/01/14  1112 History   First MD Initiated Contact with Patient 06/01/14 1135     Chief Complaint  Patient presents with  . Leg Swelling   (Consider location/radiation/quality/duration/timing/severity/associated sxs/prior Treatment) HPI Jalei c/o lower leg swelling, pain to touch and warmth x 2 weeks. Started in left leg, now feels it is in right as well. Also c/o associated lethargy, nausea and decreased appetite. Left leg does not have any significant pain now. No definite fever or chills, but she is concerned this might be cellulitis. Was treated for URI this past month but those symptoms resolved. Denies chest pain or cough or shortness of breath. No abdominal pain or nausea or vomiting. She has a past medical history of morbid obesity.  Remainder of Review of Systems negative for acute change except as noted in the HPI.  Past Medical History  Diagnosis Date  . Asthma   . Hypertension   . Sleep apnea   . GERD (gastroesophageal reflux disease)   . Kidney stones    Past Surgical History  Procedure Laterality Date  . Gyn surgery    . Parathyroid exploration    . Ovary surgery     Family History  Problem Relation Age of Onset  . Diabetes Mother   . Alzheimer's disease Mother   . Heart failure Father   . COPD Father    History  Substance Use Topics  . Smoking status: Never Smoker   . Smokeless tobacco: Never Used  . Alcohol Use: No   OB History    No data available     Review of Systems  Allergies  Bee venom; Doxycycline; Penicillins; Shrimp; and Sulfonamide derivatives  Home Medications   Prior to Admission medications   Medication Sig Start Date End Date Taking? Authorizing Provider  albuterol (PROVENTIL HFA;VENTOLIN HFA) 108 (90 BASE) MCG/ACT inhaler Inhale 2 puffs into the lungs every 4 (four) hours as needed for wheezing or shortness of breath. 04/24/14   Lattie Haw, MD  cephALEXin (KEFLEX) 500 MG capsule Take 1  capsule (500 mg total) by mouth 3 (three) times daily. 06/01/14   Lajean Manes, MD  lisinopril (PRINIVIL,ZESTRIL) 10 MG tablet Take 20 mg by mouth daily.     Historical Provider, MD  mometasone (NASONEX) 50 MCG/ACT nasal spray Place 2 sprays into the nose daily.    Historical Provider, MD  Pantoprazole Sodium (PROTONIX PO) Take by mouth.    Historical Provider, MD  potassium citrate (UROCIT-K) 10 MEQ (1080 MG) SR tablet Take 10 mEq by mouth 3 (three) times daily with meals.    Historical Provider, MD  spironolactone (ALDACTONE) 100 MG tablet Take 100 mg by mouth daily.    Historical Provider, MD  traMADol-acetaminophen (ULTRACET) 37.5-325 MG per tablet 1 or 2 every 6 hours as needed for moderate-severe pain.  Caution: May cause drowsiness 06/01/14   Lajean Manes, MD   BP 138/82 mmHg  Pulse 111  Temp(Src) 97.8 F (36.6 C) (Oral)  Resp 20  Wt 426 lb (193.232 kg)  SpO2 98% Physical Exam  Constitutional: She is oriented to person, place, and time. She appears well-developed and well-nourished. No distress.  Pleasant female, morbidly obese, no acute cardiorespiratory distress. She is uncomfortable from right calf and leg pain.  HENT:  Head: Normocephalic and atraumatic.  Mouth/Throat: Oropharynx is clear and moist.  Eyes: Conjunctivae and EOM are normal. Pupils are equal, round, and reactive to light. No scleral icterus.  Neck: Normal range of motion. Neck supple. No JVD present. No tracheal deviation present.  Cardiovascular: Normal rate, regular rhythm and normal heart sounds.  Exam reveals no gallop and no friction rub.   No murmur heard. Pulmonary/Chest: Effort normal and breath sounds normal. No respiratory distress. She has no wheezes. She has no rales. She exhibits no tenderness.  Abdominal: She exhibits no distension. There is no tenderness.  Musculoskeletal: Normal range of motion.  Right leg: 4+ edema, with redness and tenderness and warmth right calf. Neurovascular distally  intact. Very indurated, no definite cords felt.  Left leg: 2+ edema, without any tenderness or warmth. No definite induration or cords. Neurovascular distally intact.  No bony tenderness of the lower extremities.  Lymphadenopathy:    She has no cervical adenopathy.  Neurological: She is alert and oriented to person, place, and time. No cranial nerve deficit.  Skin: Skin is warm. No rash noted.  Psychiatric: She has a normal mood and affect.  Nursing note and vitals reviewed.   ED Course  Procedures (including critical care time) Labs Review Labs Reviewed - No data to display 12:30 PM Discussed with patient that we need to rule out DVT right leg. Stat ultrasound venous Doppler right leg ordered.--Patient voiced agreement with these plans Imaging Review US Venous Img Lower Unilateral Right  06/01/2014   CLINICAL DATA:  RIGHT leg pain and tenderness. Initial encounter. RIGHT calf pain.  EXAM: RIGHT LOWER EXTREMITY VENOUS DOPPLER ULTRASOUND  TECHNIQUE: Gray-scale sonography with graded compression, as well as color Doppler and duplex ultrasound were performed to evaluate the lower extremity deep venous systems from the level of the common femoral vein and including the common femoral, femoral, profunda femoral, popliteal and calf veins including the posterior tibial, peroneal and gastrocnemius veins when visible. The superficial great saphenous vein was also interrogated. Spectral Doppler was utilized to evaluate flow at rest and with distal augmentation maneuvers in the common femoral, femoral and popliteal veins.  COMPARISON:  None.  FINDINGS: Contralateral Common Femoral Vein: Respiratory phasicity is normal and symmetric with the symptomatic side. No evidence of thrombus. Normal compressibility.  Common Femoral Vein: No evidence of thrombus. Normal compressibility, respiratory phasicity and response to augmentation.  Saphenofemoral Junction: No evidence of thrombus. Normal compressibility and  flow on color Doppler imaging.  Profunda Femoral Vein: No evidence of thrombus. Normal compressibility and flow on color Doppler imaging.  Femoral Vein: No evidence of thrombus. Normal compressibility, respiratory phasicity and response to augmentation.  Popliteal Vein: No evidence of thrombus. Normal compressibility, respiratory phasicity and response to augmentation.  Calf Veins: Poor visualization of the calf veins due to obese body habitus.  Superficial Great Saphenous Vein: No evidence of thrombus. Normal compressibility and flow on color Doppler imaging.  Venous Reflux:  None.  Other Findings:  Exam technically degraded by obese body habitus.  IMPRESSION: 1. Negative for DVT. 2. Exam technically degraded by obesity.   Electronically Signed   By: Andreas Newport M.D.   On: 06/01/2014 13:29     MDM   1. Cellulitis of leg, right   2. Acute leg pain, right   3. Pain and swelling of right lower leg   US Venous Img Lower Unilateral Right  : . Negative for DVT.   Reviewed the negative venous ultrasound with patient. No evidence of DVT Treatment options discussed, as well as risks, benefits, alternatives. Patient voiced understanding and agreement with the following plans: Discharge Medication List as of 06/01/2014  1:53 PM  START taking these medications   Details  cephALEXin (KEFLEX) 500 MG capsule Take 1 capsule (500 mg total) by mouth 3 (three) times daily., Starting 06/01/2014, Until Discontinued, Print    traMADol-acetaminophen (ULTRACET) 37.5-325 MG per tablet 1 or 2 every 6 hours as needed for moderate-severe pain.  Caution: May cause drowsiness, Print      (reviewed drug allergies. Although she has a history of penicillin allergy in the remote past, she has since taken cephalexin in cephalosporins without any problems) Elevate leg and other symptomatic care discussed. Close follow-up with PCP within 3 days, sooner if worse or new symptoms or red flags. Patient voiced understanding and  agreement.    Lajean Manesavid Massey, MD 06/02/14 1543  Lajean Manesavid Massey, MD 06/02/14 [phone]

## 2014-06-01 NOTE — ED Notes (Signed)
Stanton KidneyDebra c/o lower leg swelling, pain to touch and warmth x 2 weeks. Started in left leg, now feels it is in right as well. Also c/o associated lethargy, nausea and decreased appetite.

## 2014-06-07 ENCOUNTER — Telehealth: Payer: Self-pay | Admitting: *Deleted

## 2014-06-08 ENCOUNTER — Emergency Department (INDEPENDENT_AMBULATORY_CARE_PROVIDER_SITE_OTHER)
Admission: EM | Admit: 2014-06-08 | Discharge: 2014-06-08 | Disposition: A | Discharge status: Home / Self Care | Payer: BLUE CROSS/BLUE SHIELD | Source: Home / Self Care | Attending: Emergency Medicine | Admitting: Emergency Medicine

## 2014-06-08 ENCOUNTER — Encounter: Payer: Self-pay | Admitting: Emergency Medicine

## 2014-06-08 DIAGNOSIS — L03115 Cellulitis of right lower limb: Secondary | ICD-10-CM

## 2014-06-08 LAB — POCT CBC W AUTO DIFF (K'VILLE URGENT CARE)

## 2014-06-08 MED ORDER — CLINDAMYCIN HCL 300 MG PO CAPS: 300.0000 mg | ORAL_CAPSULE | Freq: Four times a day (QID) | ORAL | Status: DC

## 2014-06-08 NOTE — ED Provider Notes (Signed)
CSN: [id number]     Arrival date & time 06/08/14  1058 History   First MD Initiated Contact with Patient 06/08/14 1201     Chief Complaint  Patient presents with  . Follow-up   (Consider location/radiation/quality/duration/timing/severity/associated sxs/prior Treatment) Patient is a 54 y.o. female presenting with leg pain. The history is provided by the patient. No language interpreter was used.  Leg Pain Location:  Leg and ankle Time since incident:  2 weeks Injury: no   Leg location:  R leg and L leg Ankle location:  L ankle and R ankle Pain details:    Quality:  Aching   Radiates to:  Does not radiate   Severity:  Mild   Onset quality:  Gradual   Timing:  Constant   Progression:  Worsening Chronicity:  New Dislocation: no   Foreign body present:  No foreign bodies Prior injury to area:  No Relieved by:  Nothing Worsened by:  Nothing tried Ineffective treatments:  None tried Associated symptoms: no back pain   Pt seen here on 4/6 and treated with keflex for cellulitis.  Pt had a negative ultrasound.  Pt reports swelling in both legs now.  Pt thinks right has gone down but still has some swelling  Past Medical History  Diagnosis Date  . Asthma   . Hypertension   . Sleep apnea   . GERD (gastroesophageal reflux disease)   . Kidney stones    Past Surgical History  Procedure Laterality Date  . Gyn surgery    . Parathyroid exploration    . Ovary surgery     Family History  Problem Relation Age of Onset  . Diabetes Mother   . Alzheimer's disease Mother   . Heart failure Father   . COPD Father    History  Substance Use Topics  . Smoking status: Never Smoker   . Smokeless tobacco: Never Used  . Alcohol Use: No   OB History    No data available     Review of Systems  Musculoskeletal: Negative for back pain.  All other systems reviewed and are negative.   Allergies  Bee venom; Doxycycline; Penicillins; Shrimp; and Sulfonamide derivatives  Home Medications    Prior to Admission medications   Medication Sig Start Date End Date Taking? Authorizing Provider  albuterol (PROVENTIL HFA;VENTOLIN HFA) 108 (90 BASE) MCG/ACT inhaler Inhale 2 puffs into the lungs every 4 (four) hours as needed for wheezing or shortness of breath. 04/24/14   Lattie HawStephen A Beese, MD  cephALEXin (KEFLEX) 500 MG capsule Take 1 capsule (500 mg total) by mouth 3 (three) times daily. 06/01/14   Lajean Manesavid Massey, MD  clindamycin (CLEOCIN) 300 MG capsule Take 1 capsule (300 mg total) by mouth every 6 (six) hours. 06/08/14   Elson AreasLeslie K Sofia, PA-C  lisinopril (PRINIVIL,ZESTRIL) 10 MG tablet Take 20 mg by mouth daily.     Historical Provider, MD  mometasone (NASONEX) 50 MCG/ACT nasal spray Place 2 sprays into the nose daily.    Historical Provider, MD  Pantoprazole Sodium (PROTONIX PO) Take by mouth.    Historical Provider, MD  potassium citrate (UROCIT-K) 10 MEQ (1080 MG) SR tablet Take 10 mEq by mouth 3 (three) times daily with meals.    Historical Provider, MD  spironolactone (ALDACTONE) 100 MG tablet Take 100 mg by mouth daily.    Historical Provider, MD  traMADol-acetaminophen (ULTRACET) 37.5-325 MG per tablet 1 or 2 every 6 hours as needed for moderate-severe pain.  Caution: May cause drowsiness 06/01/14  Lajean Manes, MD   BP 132/83 mmHg  Pulse 98  Temp(Src) 98 F (36.7 C) (Oral)  Resp 16  Ht 5' (1.524 m)  Wt 426 lb 4 oz (193.346 kg)  BMI 83.25 kg/m2  SpO2 100% Physical Exam  Constitutional: She is oriented to person, place, and time. She appears well-developed and well-nourished.  HENT:  Head: Normocephalic and atraumatic.  Right Ear: External ear normal.  Left Ear: External ear normal.  Nose: Nose normal.  Mouth/Throat: Oropharynx is clear and moist.  Eyes: Conjunctivae and EOM are normal. Pupils are equal, round, and reactive to light.  Neck: Normal range of motion.  Cardiovascular: Normal rate.   Pulmonary/Chest: Effort normal.  Abdominal: Soft. She exhibits no distension.   Musculoskeletal: She exhibits edema and tenderness.  Rough feeling skin,  Erythema but no heat.    Neurological: She is alert and oriented to person, place, and time.  Skin: Skin is warm.  Psychiatric: She has a normal mood and affect.  Nursing note and vitals reviewed.   ED Course  Procedures (including critical care time) Labs Review Labs Reviewed  POCT CBC W AUTO DIFF ([facility])   Results for orders placed or performed during the hospital encounter of 06/08/14  CBC w auto diff ([facility])  Result Value Ref Range   WBC  4.5 - 10.5 K/uL   Lymphocytes relative %  15 - 45 %   Monocytes relative %  2 - 10 %   Neutrophils relative % (GR)  44 - 76 %   Lymphocytes absolute  0.1 - 1.8 K/uL   Monocyes absolute  0.1 - 1 K/uL   Neutrophils absolute (GR#)  1.7 - 7.8 K/uL   RBC  3.8 - 5.1 MIL/uL   Hemoglobin  11.8 - 15.5 g/dL   Hematocrit  91.4 - 46 %   MCV  78 - 100 fL   MCH  26 - 32 pg   MCHC  32 - 36.5 g/dL   RDW  78.2 - 14 %   Platelet count  140 - 400 K/uL   MPV  7.8 - 11 fL   US Venous Img Lower Unilateral Right  06/01/2014   CLINICAL DATA:  RIGHT leg pain and tenderness. Initial encounter. RIGHT calf pain.  EXAM: RIGHT LOWER EXTREMITY VENOUS DOPPLER ULTRASOUND  TECHNIQUE: Gray-scale sonography with graded compression, as well as color Doppler and duplex ultrasound were performed to evaluate the lower extremity deep venous systems from the level of the common femoral vein and including the common femoral, femoral, profunda femoral, popliteal and calf veins including the posterior tibial, peroneal and gastrocnemius veins when visible. The superficial great saphenous vein was also interrogated. Spectral Doppler was utilized to evaluate flow at rest and with distal augmentation maneuvers in the common femoral, femoral and popliteal veins.  COMPARISON:  None.  FINDINGS: Contralateral Common Femoral Vein: Respiratory phasicity is normal and symmetric with the  symptomatic side. No evidence of thrombus. Normal compressibility.  Common Femoral Vein: No evidence of thrombus. Normal compressibility, respiratory phasicity and response to augmentation.  Saphenofemoral Junction: No evidence of thrombus. Normal compressibility and flow on color Doppler imaging.  Profunda Femoral Vein: No evidence of thrombus. Normal compressibility and flow on color Doppler imaging.  Femoral Vein: No evidence of thrombus. Normal compressibility, respiratory phasicity and response to augmentation.  Popliteal Vein: No evidence of thrombus. Normal compressibility, respiratory phasicity and response to augmentation.  Calf Veins: Poor visualization of the calf veins due to obese  body habitus.  Superficial Great Saphenous Vein: No evidence of thrombus. Normal compressibility and flow on color Doppler imaging.  Venous Reflux:  None.  Other Findings:  Exam technically degraded by obese body habitus.  IMPRESSION: 1. Negative for DVT. 2. Exam technically degraded by obesity.   Electronically Signed   By: Andreas Newport M.D.   On: 06/01/2014 13:29    Imaging Review No results found.   MDM Pt's legs have the appearance of chronic cellulitis.  I will add clindamycin.  Pt advised close follow up. Wbc's normal.     1. Cellulitis of leg, right    Clindamycin Finish keflex See your Primary Md for recheck in 3-4 days.  Return if any problems.    Lonia Skinner [place], PA-C 06/09/14 [phone]

## 2014-06-08 NOTE — Discharge Instructions (Signed)

## 2014-06-08 NOTE — ED Notes (Signed)
Venipuncture times two to obtain blood for CBC.

## 2014-06-08 NOTE — ED Notes (Signed)
Patient states she is here for a recheck of her cellulitis she was seen her on 06/01/14. Bilateral lower legs with redness and edema noted.

## 2015-09-09 ENCOUNTER — Emergency Department (INDEPENDENT_AMBULATORY_CARE_PROVIDER_SITE_OTHER)
Admission: EM | Admit: 2015-09-09 | Discharge: 2015-09-09 | Disposition: A | Discharge status: Home / Self Care | Payer: BLUE CROSS/BLUE SHIELD | Source: Home / Self Care | Attending: Family Medicine | Admitting: Family Medicine

## 2015-09-09 ENCOUNTER — Encounter: Payer: Self-pay | Admitting: Emergency Medicine

## 2015-09-09 DIAGNOSIS — R300 Dysuria: Secondary | ICD-10-CM | POA: Diagnosis not present

## 2015-09-09 DIAGNOSIS — J029 Acute pharyngitis, unspecified: Secondary | ICD-10-CM | POA: Diagnosis not present

## 2015-09-09 DIAGNOSIS — R197 Diarrhea, unspecified: Secondary | ICD-10-CM | POA: Diagnosis not present

## 2015-09-09 LAB — POCT URINALYSIS DIP (MANUAL ENTRY)
Bilirubin, UA: NEGATIVE
Glucose, UA: NEGATIVE
Ketones, POC UA: NEGATIVE
Leukocytes, UA: NEGATIVE
Nitrite, UA: NEGATIVE
Protein Ur, POC: NEGATIVE
Spec Grav, UA: 1.025 (ref 1.005–1.03)
Urobilinogen, UA: 0.2 (ref 0–1)
pH, UA: 7 (ref 5–8)

## 2015-09-09 LAB — POCT RAPID STREP A (OFFICE): Rapid Strep A Screen: NEGATIVE

## 2015-09-09 MED ORDER — DICYCLOMINE HCL 20 MG PO TABS: 20.0000 mg | ORAL_TABLET | Freq: Two times a day (BID) | ORAL | Status: DC

## 2015-09-09 NOTE — ED Provider Notes (Signed)
CSN: [id number]     Arrival date & time 09/09/15  1244 History   First MD Initiated Contact with Patient 09/09/15 1331     Chief Complaint  Patient presents with  . Diarrhea  . Sore Throat   (Consider location/radiation/quality/duration/timing/severity/associated sxs/prior Treatment) HPI  Brittney Murray is a 55 y.o. female presenting to UC with c/o intermittent diarrhea with generalized fatigue, aching, sore throat and dysuria for over 1 month. She had about 7 episodes of watery diarrhea yesterday but none today. Pt notes the diarrhea resolves after she takes generic Imodium.  Mild nausea and abdominal cramping but denies abdominal pain. Denies fever, chills, or vomiting.  Denies sick contacts or recent travel. She notes her PA just left the practice and she is currently looking for a new PCP.   Past Medical History  Diagnosis Date  . Asthma   . Hypertension   . Sleep apnea   . GERD (gastroesophageal reflux disease)   . Kidney stones    Past Surgical History  Procedure Laterality Date  . Gyn surgery    . Parathyroid exploration    . Ovary surgery     Family History  Problem Relation Age of Onset  . Diabetes Mother   . Alzheimer's disease Mother   . Heart failure Father   . COPD Father    Social History  Substance Use Topics  . Smoking status: Never Smoker   . Smokeless tobacco: Never Used  . Alcohol Use: No   OB History    No data available     Review of Systems  Constitutional: Negative for fever and chills.  HENT: Positive for sore throat. Negative for congestion, ear pain, rhinorrhea, trouble swallowing and voice change.   Respiratory: Negative for cough and shortness of breath.   Cardiovascular: Negative for chest pain and palpitations.  Gastrointestinal: Positive for abdominal pain and diarrhea. Negative for nausea and vomiting.  Genitourinary: Positive for dysuria. Negative for urgency, frequency, hematuria and flank pain.  Musculoskeletal: Negative for  myalgias, back pain and arthralgias.  Skin: Negative for rash.    Allergies  Bee venom; Doxycycline; Penicillins; Shrimp; and Sulfonamide derivatives  Home Medications   Prior to Admission medications   Medication Sig Start Date End Date Taking? Authorizing Provider  albuterol (PROVENTIL HFA;VENTOLIN HFA) 108 (90 BASE) MCG/ACT inhaler Inhale 2 puffs into the lungs every 4 (four) hours as needed for wheezing or shortness of breath. 04/24/14   Lattie Haw, MD  cephALEXin (KEFLEX) 500 MG capsule Take 1 capsule (500 mg total) by mouth 3 (three) times daily. 06/01/14   Lajean Manes, MD  clindamycin (CLEOCIN) 300 MG capsule Take 1 capsule (300 mg total) by mouth every 6 (six) hours. 06/08/14   Elson Areas, PA-C  dicyclomine (BENTYL) 20 MG tablet Take 1 tablet (20 mg total) by mouth 2 (two) times daily. 09/09/15   Junius Finner, PA-C  lisinopril (PRINIVIL,ZESTRIL) 10 MG tablet Take 20 mg by mouth daily.     Historical Provider, MD  mometasone (NASONEX) 50 MCG/ACT nasal spray Place 2 sprays into the nose daily.    Historical Provider, MD  Pantoprazole Sodium (PROTONIX PO) Take by mouth.    Historical Provider, MD  potassium citrate (UROCIT-K) 10 MEQ (1080 MG) SR tablet Take 10 mEq by mouth 3 (three) times daily with meals.    Historical Provider, MD  spironolactone (ALDACTONE) 100 MG tablet Take 100 mg by mouth daily.    Historical Provider, MD  traMADol-acetaminophen (ULTRACET) 37.5-325 MG per  tablet 1 or 2 every 6 hours as needed for moderate-severe pain.  Caution: May cause drowsiness 06/01/14   Lajean Manesavid Massey, MD   Meds Ordered and Administered this Visit  Medications - No data to display  BP 159/87 mmHg  Pulse 90  Temp(Src) 98.5 F (36.9 C) (Oral)  Resp 17  Ht 5' (1.524 m)  Wt 411 lb 4 oz (186.542 kg)  BMI 80.32 kg/m2  SpO2 98% No data found.   Physical Exam  Constitutional: She appears well-developed and well-nourished. No distress.  Morbidly obese female sitting in exam chair,  NAD.  HENT:  Head: Normocephalic and atraumatic.  Right Ear: Tympanic membrane normal.  Left Ear: Tympanic membrane normal.  Nose: Nose normal.  Mouth/Throat: Uvula is midline and mucous membranes are normal. Posterior oropharyngeal erythema present. No oropharyngeal exudate, posterior oropharyngeal edema or tonsillar abscesses.  Eyes: Conjunctivae are normal. No scleral icterus.  Neck: Normal range of motion. Neck supple.  Cardiovascular: Normal rate, regular rhythm and normal heart sounds.   Pulmonary/Chest: Effort normal and breath sounds normal. No respiratory distress. She has no wheezes. She has no rales.  Abdominal: Soft. She exhibits no distension. There is no tenderness.  Morbidly obese abdomen- soft, non-tender.  Musculoskeletal: Normal range of motion.  Neurological: She is alert.  Skin: Skin is warm and dry. She is not diaphoretic.  Nursing note and vitals reviewed.   ED Course  Procedures (including critical care time)  Labs Review Labs Reviewed  POCT URINALYSIS DIP (MANUAL ENTRY) - Abnormal; Notable for the following:    Blood, UA trace-intact (*)    All other components within normal limits  CBC WITH DIFFERENTIAL/PLATELET  COMPLETE METABOLIC PANEL WITH GFR  POCT RAPID STREP A (OFFICE)    Imaging Review No results found.    MDM   1. Intermittent diarrhea   2. Sore throat   3. Dysuria    Pt presenting with multiple complaints including intermittent diarrhea, sore throat and dysuria for over 1 month. Pt is morbidly obese. Vitals: WNL  Rapid strep: Negative UA: unremarkable Attempted to get CBC and CMP- unable to collect blood, will have pt come back tomorrow for lab blood draw. Pt stable for discharge home. Will try trial of Bentyl. Pt resource guide to establish care with PCP    Junius FinnerErin O'Malley, PA-C 09/09/15 1444

## 2015-09-09 NOTE — ED Notes (Signed)
Patient presents with Diarrhea , general fatigue, achy intermittently over a month.

## 2015-09-10 ENCOUNTER — Telehealth: Payer: Self-pay | Admitting: Emergency Medicine

## 2015-09-10 LAB — COMPLETE METABOLIC PANEL WITH GFR
ALT: 18 U/L (ref 6–29)
AST: 15 U/L (ref 10–35)
Albumin: 4.1 g/dL (ref 3.6–5.1)
Alkaline Phosphatase: 83 U/L (ref 33–130)
BUN: 18 mg/dL (ref 7–25)
CO2: 22 mmol/L (ref 20–31)
Calcium: 9.5 mg/dL (ref 8.6–10.4)
Chloride: 104 mmol/L (ref 98–110)
Creat: 0.83 mg/dL (ref 0.50–1.05)
GFR, Est African American: >89 mL/min (ref >=60)
GFR, Est Non African American: 81 mL/min (ref >=60)
Glucose, Bld: 97 mg/dL (ref 65–99)
Potassium: 4.3 mmol/L (ref 3.5–5.3)
Sodium: 138 mmol/L (ref 135–146)
Total Bilirubin: 0.4 mg/dL (ref 0.2–1.2)
Total Protein: 7.1 g/dL (ref 6.1–8.1)

## 2015-09-10 LAB — CBC WITH DIFFERENTIAL/PLATELET
Basophils Absolute: 0 cells/uL (ref 0–200)
Basophils Relative: 0 %
Eosinophils Absolute: 332 cells/uL (ref 15–500)
Eosinophils Relative: 4 %
HCT: 39.8 % (ref 35.0–45.0)
Hemoglobin: 13.4 g/dL (ref 11.7–15.5)
Lymphocytes Relative: 25 %
Lymphs Abs: 2075 cells/uL (ref 850–3900)
MCH: 27.8 pg (ref 27.0–33.0)
MCHC: 33.7 g/dL (ref 32.0–36.0)
MCV: 82.6 fL (ref 80.0–100.0)
MPV: 9.9 fL (ref 7.5–12.5)
Monocytes Absolute: 498 cells/uL (ref 200–950)
Monocytes Relative: 6 %
Neutro Abs: 5395 cells/uL (ref 1500–7800)
Neutrophils Relative %: 65 %
Platelets: 316 10*3/uL (ref 140–400)
RBC: 4.82 MIL/uL (ref 3.80–5.10)
RDW: 14.9 % (ref 11.0–15.0)
WBC: 8.3 10*3/uL (ref 3.8–10.8)

## 2015-09-12 ENCOUNTER — Telehealth: Payer: Self-pay | Admitting: Emergency Medicine

## 2015-09-12 NOTE — Telephone Encounter (Signed)
All labs normal

## 2015-11-09 ENCOUNTER — Ambulatory Visit: Payer: BLUE CROSS/BLUE SHIELD | Admitting: Osteopathic Medicine

## 2015-11-14 ENCOUNTER — Ambulatory Visit: Payer: BLUE CROSS/BLUE SHIELD | Admitting: Osteopathic Medicine

## 2016-02-08 ENCOUNTER — Encounter: Payer: Self-pay | Admitting: *Deleted

## 2016-02-08 ENCOUNTER — Emergency Department
Admission: EM | Admit: 2016-02-08 | Discharge: 2016-02-08 | Disposition: A | Discharge status: Home / Self Care | Payer: Medicare Other | Source: Home / Self Care | Attending: Family Medicine | Admitting: Family Medicine

## 2016-02-08 DIAGNOSIS — J209 Acute bronchitis, unspecified: Secondary | ICD-10-CM

## 2016-02-08 DIAGNOSIS — J029 Acute pharyngitis, unspecified: Secondary | ICD-10-CM

## 2016-02-08 MED ORDER — IPRATROPIUM-ALBUTEROL 0.5-2.5 (3) MG/3ML IN SOLN
3.0000 mL | Freq: Once | RESPIRATORY_TRACT | Status: AC
  Administered 2016-02-08: 3 mL via RESPIRATORY_TRACT

## 2016-02-08 MED ORDER — PREDNISONE 20 MG PO TABS: ORAL_TABLET | ORAL | 0 refills | Status: DC

## 2016-02-08 MED ORDER — ALBUTEROL SULFATE HFA 108 (90 BASE) MCG/ACT IN AERS: 1.0000 | INHALATION_SPRAY | Freq: Four times a day (QID) | RESPIRATORY_TRACT | 0 refills | Status: DC | PRN

## 2016-02-08 MED ORDER — AZITHROMYCIN 250 MG PO TABS: 250.0000 mg | ORAL_TABLET | Freq: Every day | ORAL | 0 refills | Status: DC

## 2016-02-08 MED ORDER — BENZONATATE 100 MG PO CAPS: 100.0000 mg | ORAL_CAPSULE | Freq: Three times a day (TID) | ORAL | 0 refills | Status: DC

## 2016-02-08 MED ORDER — METHYLPREDNISOLONE SODIUM SUCC 40 MG IJ SOLR
80.0000 mg | Freq: Once | INTRAMUSCULAR | Status: AC
  Administered 2016-02-08: 80 mg via INTRAMUSCULAR

## 2016-02-08 NOTE — Discharge Instructions (Signed)
°  You were given a shot of solumedrol (a steroid) today to help with inflammation in your chest to help you breath better and to help with your cough.  You have been prescribed 5 days of prednisone, an oral steroid.  You may start this medication tomorrow with breakfast.    Please take antibiotics as prescribed and be sure to complete entire course even if you start to feel better to ensure infection does not come back.

## 2016-02-08 NOTE — ED Provider Notes (Signed)
CSN: [id number]     Arrival date & time 02/08/16  1033 History   First MD Initiated Contact with Patient 02/08/16 1126     Chief Complaint  Patient presents with  . Cough   (Consider location/radiation/quality/duration/timing/severity/associated sxs/prior Treatment) HPI Brittney Murray is a 55 y.o. female presenting to UC with c/o 2 months of sinus congestion and pressure that has developed into a worsening productive cough for 3 days with clear sputum.  Pt notes it has exacerbated her asthma. She has been needing to use her inhaler more often.  She has taken Mucinexx, Alka Seltzer, and used nasal spray with minimal relief. She has had prednisone in the past for her asthma. Denies fever, chills, n/v/d.    Past Medical History:  Diagnosis Date  . Asthma   . GERD (gastroesophageal reflux disease)   . Hypertension   . Kidney stones   . Sleep apnea    Past Surgical History:  Procedure Laterality Date  . GYN surgery    . OVARY SURGERY    . PARATHYROID EXPLORATION     Family History  Problem Relation Age of Onset  . Diabetes Mother   . Alzheimer's disease Mother   . Heart failure Father   . COPD Father    Social History  Substance Use Topics  . Smoking status: Never Smoker  . Smokeless tobacco: Never Used  . Alcohol use No   OB History    No data available     Review of Systems  Constitutional: Negative for chills and fever.  HENT: Positive for congestion, postnasal drip, rhinorrhea, sinus pressure and sore throat. Negative for ear pain, sinus pain, trouble swallowing and voice change.   Respiratory: Positive for cough, chest tightness and wheezing. Negative for shortness of breath.   Cardiovascular: Negative for chest pain and palpitations.  Gastrointestinal: Negative for abdominal pain, diarrhea, nausea and vomiting.  Musculoskeletal: Negative for arthralgias, back pain and myalgias.  Skin: Negative for rash.  Neurological: Negative for dizziness, light-headedness and  headaches.    Allergies  Bee venom; Doxycycline; Penicillins; Shrimp [shellfish allergy]; and Sulfonamide derivatives  Home Medications   Prior to Admission medications   Medication Sig Start Date End Date Taking? Authorizing Provider  albuterol (PROVENTIL HFA;VENTOLIN HFA) 108 (90 BASE) MCG/ACT inhaler Inhale 2 puffs into the lungs every 4 (four) hours as needed for wheezing or shortness of breath. 04/24/14   Lattie HawStephen A Beese, MD  albuterol (PROVENTIL HFA;VENTOLIN HFA) 108 (90 Base) MCG/ACT inhaler Inhale 1-2 puffs into the lungs every 6 (six) hours as needed for wheezing or shortness of breath. 02/08/16   Junius FinnerErin O'Malley, PA-C  azithromycin (ZITHROMAX) 250 MG tablet Take 1 tablet (250 mg total) by mouth daily. Take first 2 tablets together, then 1 every day until finished. 02/08/16   Junius FinnerErin O'Malley, PA-C  benzonatate (TESSALON) 100 MG capsule Take 1-2 capsules (100-200 mg total) by mouth every 8 (eight) hours. 02/08/16   Junius FinnerErin O'Malley, PA-C  dicyclomine (BENTYL) 20 MG tablet Take 1 tablet (20 mg total) by mouth 2 (two) times daily. 09/09/15   Junius FinnerErin O'Malley, PA-C  lisinopril (PRINIVIL,ZESTRIL) 10 MG tablet Take 20 mg by mouth daily.     Historical Provider, MD  mometasone (NASONEX) 50 MCG/ACT nasal spray Place 2 sprays into the nose daily.    Historical Provider, MD  Pantoprazole Sodium (PROTONIX PO) Take by mouth.    Historical Provider, MD  potassium citrate (UROCIT-K) 10 MEQ (1080 MG) SR tablet Take 10 mEq by mouth 3 (three)  times daily with meals.    Historical Provider, MD  predniSONE (DELTASONE) 20 MG tablet 3 tabs po day one, then 2 po daily x 4 days 02/08/16   Junius FinnerErin O'Malley, PA-C  spironolactone (ALDACTONE) 100 MG tablet Take 100 mg by mouth daily.    Historical Provider, MD  traMADol-acetaminophen (ULTRACET) 37.5-325 MG per tablet 1 or 2 every 6 hours as needed for moderate-severe pain.  Caution: May cause drowsiness 06/01/14   Lajean Manesavid Massey, MD   Meds Ordered and Administered this Visit    Medications  methylPREDNISolone sodium succinate (SOLU-MEDROL) 40 mg/mL injection 80 mg (80 mg Intramuscular Given 02/08/16 1144)  ipratropium-albuterol (DUONEB) 0.5-2.5 (3) MG/3ML nebulizer solution 3 mL (3 mLs Nebulization Given 02/08/16 1144)    BP 168/85 (BP Location: Left Arm)   Pulse 119   Temp 98.5 F (36.9 C) (Oral)   Resp 18   Wt (!) 418 lb (189.6 kg)   SpO2 97%   BMI 81.64 kg/m  No data found.   Physical Exam  Constitutional: She appears well-developed and well-nourished. No distress.  HENT:  Head: Normocephalic and atraumatic.  Right Ear: Tympanic membrane normal.  Left Ear: Tympanic membrane normal.  Nose: Nose normal.  Mouth/Throat: Uvula is midline, oropharynx is clear and moist and mucous membranes are normal.  Eyes: Conjunctivae are normal. No scleral icterus.  Neck: Normal range of motion. Neck supple.  Cardiovascular: Normal rate, regular rhythm and normal heart sounds.   Pulmonary/Chest: Effort normal. No respiratory distress. She has wheezes. She has rhonchi in the left lower field. She has rales in the left lower field.  Faint diffuse expiratory wheeze, coarse breath sounds in LLF  Abdominal: Soft. She exhibits no distension. There is no tenderness.  Musculoskeletal: Normal range of motion.  Neurological: She is alert.  Skin: Skin is warm and dry. She is not diaphoretic.  Nursing note and vitals reviewed.   Urgent Care Course   Clinical Course     Procedures (including critical care time)  Labs Review Labs Reviewed - No data to display  Imaging Review No results found.   MDM   1. Acute bronchitis, unspecified organism   2. Pharyngitis, unspecified etiology    Pt c/o 2 months of sinus symptoms with 3 days of asthma exacerbation with cough and wheeze.  Coarse breath sounds in left lower lung field.  Solumedrol and duoneb given in UC. Lung sounds did improve but coarse breath sounds still noted in LLF.  Rx: Azithromycin, prednisone,  tessalon and albuterol inhaler refill.   Junius Finnerrin O'Malley, PA-C 02/08/16 1327

## 2016-02-08 NOTE — ED Triage Notes (Signed)
Pt c/o "sinus s/s" x 2 mths. She also c/o clear productive cough x 3 days. She has taken Mucinex, Alka Seltzer, nasal spray and albuterol inhaler.

## 2016-02-12 ENCOUNTER — Telehealth: Payer: Self-pay | Admitting: *Deleted

## 2016-02-13 ENCOUNTER — Telehealth: Payer: Self-pay | Admitting: Emergency Medicine

## 2016-02-13 MED ORDER — IPRATROPIUM BROMIDE 0.06 % NA SOLN: 2.0000 | Freq: Four times a day (QID) | NASAL | 12 refills | Status: DC

## 2016-02-13 NOTE — Telephone Encounter (Signed)
We called in Nasal spray and she can use sinus saline rinses , the ABT will stay in her system 5-10 days if not getting better in one week see PCP

## 2018-02-14 ENCOUNTER — Other Ambulatory Visit: Payer: Self-pay

## 2018-02-14 ENCOUNTER — Emergency Department
Admission: EM | Admit: 2018-02-14 | Discharge: 2018-02-14 | Disposition: A | Discharge status: Home / Self Care | Payer: Medicare HMO | Source: Home / Self Care | Attending: Family Medicine | Admitting: Family Medicine

## 2018-02-14 ENCOUNTER — Emergency Department (INDEPENDENT_AMBULATORY_CARE_PROVIDER_SITE_OTHER): Payer: Medicare HMO

## 2018-02-14 DIAGNOSIS — I10 Essential (primary) hypertension: Secondary | ICD-10-CM

## 2018-02-14 DIAGNOSIS — R05 Cough: Secondary | ICD-10-CM

## 2018-02-14 DIAGNOSIS — R350 Frequency of micturition: Secondary | ICD-10-CM | POA: Diagnosis not present

## 2018-02-14 DIAGNOSIS — R059 Cough, unspecified: Secondary | ICD-10-CM

## 2018-02-14 DIAGNOSIS — R109 Unspecified abdominal pain: Secondary | ICD-10-CM

## 2018-02-14 DIAGNOSIS — J069 Acute upper respiratory infection, unspecified: Secondary | ICD-10-CM

## 2018-02-14 LAB — POCT URINALYSIS DIP (MANUAL ENTRY)
Bilirubin, UA: NEGATIVE
Glucose, UA: NEGATIVE mg/dL
Ketones, POC UA: NEGATIVE mg/dL
Nitrite, UA: POSITIVE — AB
Protein Ur, POC: 30 mg/dL — AB
Spec Grav, UA: 1.02 (ref 1.010–1.025)
Urobilinogen, UA: 0.2 E.U./dL
pH, UA: 5.5 (ref 5.0–8.0)

## 2018-02-14 MED ORDER — CEPHALEXIN 500 MG PO CAPS: 500.0000 mg | ORAL_CAPSULE | Freq: Three times a day (TID) | ORAL | 0 refills | Status: DC

## 2018-02-14 MED ORDER — BENZONATATE 100 MG PO CAPS: 100.0000 mg | ORAL_CAPSULE | Freq: Three times a day (TID) | ORAL | 0 refills | Status: DC

## 2018-02-14 NOTE — ED Triage Notes (Signed)
Patient reports persistent cough over past 3 weeks which now hurts in upper chest. She has been trying OTCs without resolution; last med AlkaSeltzer cold plus at around 0500; no know fever.

## 2018-02-14 NOTE — ED Provider Notes (Signed)
[facility]    CSN: [id number] Arrival date & time: 02/14/18  1229     History   Chief Complaint Chief Complaint  Patient presents with  . Cough  . Nasal Congestion    HPI Brittney Murray is a 57 y.o. female.   HPI Brittney Murray is a 57 y.o. female presenting to UC with c/o persistent cough for 3 weeks, associated upper chest soreness with cough.  She has tried OTC cough/cold medication w/o relief.  She also reports bilateral flank pain and urinary frequency for about 2 weeks, she initially thought due to a viral illness. Hx of UTIs in the past. She took AlkaSeltzer Cold Plus at [facility] today w/o relief.    Past Medical History:  Diagnosis Date  . Asthma   . GERD (gastroesophageal reflux disease)   . Hypertension   . Kidney stones   . Sleep apnea     Patient Active Problem List   Diagnosis Date Noted  . HYPERTENSION 02/04/2010  . ASTHMA 02/04/2010    Past Surgical History:  Procedure Laterality Date  . GYN surgery    . OVARY SURGERY    . PARATHYROID EXPLORATION      OB History   No obstetric history on file.      Home Medications    Prior to Admission medications   Medication Sig Start Date End Date Taking? Authorizing Provider  amLODipine (NORVASC) 10 MG tablet Take 10 mg by mouth daily.   Yes [provider]  albuterol (PROVENTIL HFA;VENTOLIN HFA) 108 (90 BASE) MCG/ACT inhaler Inhale 2 puffs into the lungs every 4 (four) hours as needed for wheezing or shortness of breath. 04/24/14   Lattie HawBeese, Stephen A, MD  albuterol (PROVENTIL HFA;VENTOLIN HFA) 108 (90 Base) MCG/ACT inhaler Inhale 1-2 puffs into the lungs every 6 (six) hours as needed for wheezing or shortness of breath. 02/08/16   Lurene ShadowPhelps, Covey Baller O, PA-C  azithromycin (ZITHROMAX) 250 MG tablet Take 1 tablet (250 mg total) by mouth daily. Take first 2 tablets together, then 1 every day until finished. 02/08/16   Lurene ShadowPhelps, Siera Beyersdorf O, PA-C  benzonatate (TESSALON) 100 MG capsule Take 1-2 capsules  (100-200 mg total) by mouth every 8 (eight) hours. 02/14/18   Lurene ShadowPhelps, Pandora Mccrackin O, PA-C  cephALEXin (KEFLEX) 500 MG capsule Take 1 capsule (500 mg total) by mouth 3 (three) times daily. 02/14/18   Lurene ShadowPhelps, Hades Mathew O, PA-C  dicyclomine (BENTYL) 20 MG tablet Take 1 tablet (20 mg total) by mouth 2 (two) times daily. 09/09/15   Lurene ShadowPhelps, Sevrin Sally O, PA-C  ipratropium (ATROVENT) 0.06 % nasal spray Place 2 sprays into both nostrils 4 (four) times daily. 02/13/16   Lurene ShadowPhelps, Jamille Yoshino O, PA-C  lisinopril (PRINIVIL,ZESTRIL) 10 MG tablet Take 20 mg by mouth daily.     [provider]  mometasone (NASONEX) 50 MCG/ACT nasal spray Place 2 sprays into the nose daily.    [provider]  Pantoprazole Sodium (PROTONIX PO) Take by mouth.    [provider]  potassium citrate (UROCIT-K) 10 MEQ (1080 MG) SR tablet Take 10 mEq by mouth 3 (three) times daily with meals.    [provider]  predniSONE (DELTASONE) 20 MG tablet 3 tabs po day one, then 2 po daily x 4 days 02/08/16   Lurene ShadowPhelps, Jahkeem Kurka O, PA-C  spironolactone (ALDACTONE) 100 MG tablet Take 100 mg by mouth daily.    [provider]  traMADol-acetaminophen (ULTRACET) 37.5-325 MG per tablet 1 or 2 every 6 hours as needed for  moderate-severe pain.  Caution: May cause drowsiness 06/01/14   Lajean ManesMassey, David, MD    Family History Family History  Problem Relation Age of Onset  . Diabetes Mother   . Alzheimer's disease Mother   . Heart failure Father   . COPD Father     Social History Social History   Tobacco Use  . Smoking status: Never Smoker  . Smokeless tobacco: Never Used  Substance Use Topics  . Alcohol use: No  . Drug use: No     Allergies   Bee venom; Doxycycline; Penicillins; Shrimp [shellfish allergy]; and Sulfonamide derivatives   Review of Systems Review of Systems  Constitutional: Negative for chills and fever.  HENT: Positive for congestion, postnasal drip and rhinorrhea. Negative for ear pain and sore throat.     Respiratory: Positive for cough. Negative for wheezing.   Cardiovascular: Positive for chest pain. Negative for palpitations.  Gastrointestinal: Negative for diarrhea, nausea and vomiting.  Genitourinary: Positive for flank pain, frequency and urgency. Negative for dysuria.  Neurological: Negative for dizziness and headaches.     Physical Exam Triage Vital Signs ED Triage Vitals  Enc Vitals Group     BP 02/14/18 1415 (!) 169/91     Pulse Rate 02/14/18 1415 89     Resp 02/14/18 1415 18     Temp 02/14/18 1415 (!) 97.4 F (36.3 C)     Temp Source 02/14/18 1415 Oral     SpO2 02/14/18 1415 98 %     Weight 02/14/18 1416 (!) 400 lb (181.4 kg)     Height 02/14/18 1416 5' (1.524 m)     Head Circumference --      Peak Flow --      Pain Score 02/14/18 1416 0     Pain Loc --      Pain Edu? --      Excl. in GC? --    No data found.  Updated Vital Signs BP (!) 169/91 (BP Location: Left Wrist)   Pulse 89   Temp (!) 97.4 F (36.3 C) (Oral)   Resp 18   Ht 5' (1.524 m)   Wt (!) 400 lb (181.4 kg)   SpO2 98%   BMI 78.12 kg/m   Visual Acuity Right Eye Distance:   Left Eye Distance:   Bilateral Distance:    Right Eye Near:   Left Eye Near:    Bilateral Near:     Physical Exam Vitals signs and nursing note reviewed.  Constitutional:      Appearance: Normal appearance. She is well-developed.  HENT:     Head: Normocephalic and atraumatic.     Right Ear: Tympanic membrane normal.     Left Ear: Tympanic membrane normal.     Nose: Nose normal.     Mouth/Throat:     Lips: Pink.     Mouth: Mucous membranes are moist.     Pharynx: Oropharynx is clear. Uvula midline. No pharyngeal swelling, oropharyngeal exudate, posterior oropharyngeal erythema or uvula swelling.  Neck:     Musculoskeletal: Normal range of motion and neck supple.  Cardiovascular:     Rate and Rhythm: Normal rate and regular rhythm.  Pulmonary:     Effort: Pulmonary effort is normal. No respiratory distress.      Breath sounds: Normal breath sounds. No stridor. No wheezing or rhonchi.  Abdominal:     General: There is no distension.     Palpations: Abdomen is soft.     Tenderness: There is no abdominal  tenderness. There is no right CVA tenderness or left CVA tenderness.  Musculoskeletal: Normal range of motion.  Skin:    General: Skin is warm and dry.  Neurological:     Mental Status: She is alert and oriented to person, place, and time.  Psychiatric:        Behavior: Behavior normal.      UC Treatments / Results  Labs (all labs ordered are listed, but only abnormal results are displayed) Labs Reviewed  POCT URINALYSIS DIP (MANUAL ENTRY) - Abnormal; Notable for the following components:      Result Value   Clarity, UA cloudy (*)    Blood, UA moderate (*)    Protein Ur, POC =30 (*)    Nitrite, UA Positive (*)    Leukocytes, UA Moderate (2+) (*)    All other components within normal limits  URINE CULTURE    EKG None  Radiology Dg Chest 2 View  Result Date: 02/14/2018 CLINICAL DATA:  Persistent cough for 3 weeks, hypertension EXAM: CHEST - 2 VIEW COMPARISON:  07/16/2004 FINDINGS: The heart size and mediastinal contours are within normal limits. Both lungs are clear. The visualized skeletal structures are unremarkable. IMPRESSION: No active cardiopulmonary disease. Electronically Signed   By: Judie Petit.  Shick M.D.   On: 02/14/2018 14:45    Procedures Procedures (including critical care time)  Medications Ordered in UC Medications - No data to display  Initial Impression / Assessment and Plan / UC Course  I have reviewed the triage vital signs and the nursing notes.  Pertinent labs & imaging results that were available during my care of the patient were reviewed by me and considered in my medical decision making (see chart for details).     UA c/w UTI Reassured pt of normal CXR Will start on Keflex  Final Clinical Impressions(s) / UC Diagnoses   Final diagnoses:  Urinary  frequency  Bilateral flank pain  Upper respiratory tract infection, unspecified type  Cough     Discharge Instructions      Please take your antibiotic as prescribed. A urine culture has been sent to check the severity of your urinary infection and to determine if you are on the most appropriate antibiotic. The results should come back within 2-3 days and you will be notified even if no medication change is needed.  This antibiotic should also help treat your upper respiratory infection and cough.  Please stay well hydrated and follow up with your family doctor in 1 week if not improving, sooner if worsening.     ED Prescriptions    Medication Sig Dispense Auth. Provider   cephALEXin (KEFLEX) 500 MG capsule Take 1 capsule (500 mg total) by mouth 3 (three) times daily. 21 capsule Doroteo Glassman, Nilda Keathley O, PA-C   benzonatate (TESSALON) 100 MG capsule Take 1-2 capsules (100-200 mg total) by mouth every 8 (eight) hours. 21 capsule Lurene Shadow, PA-C     Controlled Substance Prescriptions [place] Controlled Substance Registry consulted? Not Applicable   Rolla Plate 02/16/18 0981

## 2018-02-14 NOTE — Discharge Instructions (Addendum)
°  Please take your antibiotic as prescribed. A urine culture has been sent to check the severity of your urinary infection and to determine if you are on the most appropriate antibiotic. The results should come back within 2-3 days and you will be notified even if no medication change is needed.  This antibiotic should also help treat your upper respiratory infection and cough.  Please stay well hydrated and follow up with your family doctor in 1 week if not improving, sooner if worsening.

## 2018-02-16 ENCOUNTER — Telehealth: Payer: Self-pay

## 2018-02-16 LAB — URINE CULTURE
MICRO NUMBER:: 91532102
SPECIMEN QUALITY:: ADEQUATE

## 2018-02-16 NOTE — Telephone Encounter (Signed)
Left VM with lab results, instructions, and contact information if any questions.

## 2018-02-22 ENCOUNTER — Telehealth: Payer: Self-pay

## 2018-02-22 MED ORDER — CEPHALEXIN 500 MG PO CAPS: 500.0000 mg | ORAL_CAPSULE | Freq: Three times a day (TID) | ORAL | 0 refills | Status: AC

## 2018-02-22 NOTE — Telephone Encounter (Signed)
Pt called and said she is not feeling any better.  Spoke with Dr Cathren HarshBeese, and He said patient could try Keflex 500 tid x 3 more days.  Also needs to take plain mucinex q 12, and stop the tessalon during the day and only at night.  Left message for patient to call for treatment.

## 2018-02-22 NOTE — Telephone Encounter (Signed)
Spoke to pt advised her of Dr Rolla PlateBeese's recommendations and rx sent to pharmacy.

## 2018-03-19 ENCOUNTER — Emergency Department (INDEPENDENT_AMBULATORY_CARE_PROVIDER_SITE_OTHER): Payer: Medicare HMO

## 2018-03-19 ENCOUNTER — Other Ambulatory Visit: Payer: Self-pay

## 2018-03-19 ENCOUNTER — Emergency Department
Admission: EM | Admit: 2018-03-19 | Discharge: 2018-03-19 | Disposition: A | Discharge status: Home / Self Care | Payer: Medicare HMO | Source: Home / Self Care | Attending: Emergency Medicine | Admitting: Emergency Medicine

## 2018-03-19 DIAGNOSIS — S20211A Contusion of right front wall of thorax, initial encounter: Secondary | ICD-10-CM | POA: Diagnosis not present

## 2018-03-19 DIAGNOSIS — M25551 Pain in right hip: Secondary | ICD-10-CM | POA: Diagnosis not present

## 2018-03-19 DIAGNOSIS — M1711 Unilateral primary osteoarthritis, right knee: Secondary | ICD-10-CM | POA: Diagnosis not present

## 2018-03-19 DIAGNOSIS — R0781 Pleurodynia: Secondary | ICD-10-CM

## 2018-03-19 DIAGNOSIS — M545 Low back pain: Secondary | ICD-10-CM | POA: Diagnosis not present

## 2018-03-19 DIAGNOSIS — S8001XA Contusion of right knee, initial encounter: Secondary | ICD-10-CM | POA: Diagnosis not present

## 2018-03-19 DIAGNOSIS — S7001XA Contusion of right hip, initial encounter: Secondary | ICD-10-CM

## 2018-03-19 DIAGNOSIS — W19XXXA Unspecified fall, initial encounter: Secondary | ICD-10-CM

## 2018-03-19 DIAGNOSIS — S7011XA Contusion of right thigh, initial encounter: Secondary | ICD-10-CM

## 2018-03-19 DIAGNOSIS — S300XXA Contusion of lower back and pelvis, initial encounter: Secondary | ICD-10-CM

## 2018-03-19 MED ORDER — MELOXICAM 7.5 MG PO TABS: ORAL_TABLET | ORAL | 0 refills | Status: DC

## 2018-03-19 MED ORDER — KETOROLAC TROMETHAMINE 60 MG/2ML IM SOLN
60.0000 mg | Freq: Once | INTRAMUSCULAR | Status: AC
Start: 2018-03-19 — End: 2018-03-19
  Administered 2018-03-19: 60 mg via INTRAMUSCULAR

## 2018-03-19 MED ORDER — CYCLOBENZAPRINE HCL 10 MG PO TABS: 10.0000 mg | ORAL_TABLET | Freq: Every day | ORAL | 0 refills | Status: DC

## 2018-03-19 NOTE — ED Provider Notes (Signed)
[facility]    CSN: [id number] Arrival date & time: 03/19/18  1119     History   Chief Complaint Chief Complaint  Patient presents with  . Fall    HPI [facility] is a 58 y.o. female.   HPI Pt weighs 400 pounds.  She was coming down stairs on Tuesday, 3 days ago, and shoes caught on bricks, and pt "face planted" and fell on right side.  No loss of consciousness.  No focal neurologic symptoms.  No neck or C-spine pain. Complains of persistent pain right knee diffusely, 7 out of 10 intensity, sharp and dull, worse to move and to try to weight-bear, although she is not able to weight-bear because of the pain right knee.  Also has significant pain right side/ribs, right hip, and low back.  Has some bruising. Denies any significant right calf or ankle or foot pain.  Has minimal right hand and wrist pain and shoulder pain but she feels is not significant and has full range of motion and function right upper extremity.  She states she also directly fell on abdomen, but she denies any significant abdominal or GI symptoms.  Currently no deep abdominal pain, nausea, vomiting, diarrhea, blood in stool, or change of bowel habits.  No urinary symptoms or hematuria.   She had a few leftover Ultracet from prior surgery, and that helped the pain somewhat.  Used ibuprofen and that helped somewhat.  Past Medical History:  Diagnosis Date  . Asthma   . GERD (gastroesophageal reflux disease)   . Hypertension   . Kidney stones   . Sleep apnea   Reviewed above past medical history.  No history of chronic kidney disease.  Creatinine was normal within the past year.  Patient Active Problem List   Diagnosis Date Noted  . HYPERTENSION 02/04/2010  . ASTHMA 02/04/2010    Past Surgical History:  Procedure Laterality Date  . GYN surgery    . OVARY SURGERY    . PARATHYROID EXPLORATION      OB History   No obstetric history on file.      Home Medications    Prior to Admission  medications   Medication Sig Start Date End Date Taking? Authorizing Provider  albuterol (PROVENTIL HFA;VENTOLIN HFA) 108 (90 BASE) MCG/ACT inhaler Inhale 2 puffs into the lungs every 4 (four) hours as needed for wheezing or shortness of breath. 04/24/14   Lattie HawBeese, Stephen A, MD  albuterol (PROVENTIL HFA;VENTOLIN HFA) 108 (90 Base) MCG/ACT inhaler Inhale 1-2 puffs into the lungs every 6 (six) hours as needed for wheezing or shortness of breath. 02/08/16   Lurene ShadowPhelps, Erin O, PA-C  amLODipine (NORVASC) 10 MG tablet Take 10 mg by mouth daily.    [provider]  benzonatate (TESSALON) 100 MG capsule Take 1-2 capsules (100-200 mg total) by mouth every 8 (eight) hours. 02/14/18   Lurene ShadowPhelps, Erin O, PA-C  cyclobenzaprine (FLEXERIL) 10 MG tablet Take 1 tablet (10 mg total) by mouth at bedtime. For muscle relaxant 03/19/18   Lajean ManesMassey, David, MD  dicyclomine (BENTYL) 20 MG tablet Take 1 tablet (20 mg total) by mouth 2 (two) times daily. 09/09/15   Lurene ShadowPhelps, Erin O, PA-C  ipratropium (ATROVENT) 0.06 % nasal spray Place 2 sprays into both nostrils 4 (four) times daily. 02/13/16   Lurene ShadowPhelps, Erin O, PA-C  lisinopril (PRINIVIL,ZESTRIL) 10 MG tablet Take 20 mg by mouth daily.     [provider]  meloxicam (MOBIC) 7.5 MG tablet Take 1 twice a  day as needed for pain. Take with food. (Do not take with any other NSAID.) 03/19/18   Lajean Manes, MD  mometasone (NASONEX) 50 MCG/ACT nasal spray Place 2 sprays into the nose daily.    [provider]  Pantoprazole Sodium (PROTONIX PO) Take by mouth.    [provider]  potassium citrate (UROCIT-K) 10 MEQ (1080 MG) SR tablet Take 10 mEq by mouth 3 (three) times daily with meals.    [provider]  spironolactone (ALDACTONE) 100 MG tablet Take 100 mg by mouth daily.    [provider]  traMADol-acetaminophen (ULTRACET) 37.5-325 MG per tablet 1 or 2 every 6 hours as needed for moderate-severe pain.  Caution: May cause drowsiness 06/01/14    Lajean Manes, MD    Family History Family History  Problem Relation Age of Onset  . Diabetes Mother   . Alzheimer's disease Mother   . Heart failure Father   . COPD Father     Social History Social History   Tobacco Use  . Smoking status: Never Smoker  . Smokeless tobacco: Never Used  Substance Use Topics  . Alcohol use: No  . Drug use: No     Allergies   Bee venom; Doxycycline; Penicillins; Shrimp [shellfish allergy]; and Sulfonamide derivatives   Review of Systems Review of Systems  Constitutional: Negative for fever.  HENT: Negative for facial swelling and nosebleeds.   Eyes: Negative for photophobia, pain and visual disturbance.  Respiratory: Negative for shortness of breath and stridor.   Cardiovascular: Negative for chest pain, palpitations and leg swelling (No new calf edema).  Gastrointestinal: Negative for abdominal pain, nausea and vomiting.  Genitourinary: Negative for hematuria.  Musculoskeletal: Positive for back pain and myalgias. Negative for neck pain.  Skin: Negative for wound (No open wound).  Neurological: Negative for seizures, syncope, speech difficulty, weakness (No focal weakness), light-headedness, numbness and headaches.  Psychiatric/Behavioral: Negative for hallucinations.  All other systems reviewed and are negative.    Physical Exam Triage Vital Signs ED Triage Vitals  Enc Vitals Group     BP 03/19/18 1153 (!) 163/89     Pulse Rate 03/19/18 1153 94     Resp 03/19/18 1153 20     Temp 03/19/18 1153 98.1 F (36.7 C)     Temp Source 03/19/18 1153 Oral     SpO2 03/19/18 1153 95 %     Weight 03/19/18 1154 (!) 400 lb (181.4 kg)     Height 03/19/18 1154 5' (1.524 m)     Head Circumference --      Peak Flow --      Pain Score 03/19/18 1154 7     Pain Loc --      Pain Edu? --      Excl. in GC? --    No data found.  Updated Vital Signs BP (!) 163/89 (BP Location: Left Arm)   Pulse 94   Temp 98.1 F (36.7 C) (Oral)   Resp 20    Ht 5' (1.524 m)   Wt (!) 181.4 kg   SpO2 95%   BMI 78.12 kg/m   Visual Acuity Right Eye Near:Grossly intact  Left Eye Near: Grossly intact   :     Physical Exam Vitals signs and nursing note reviewed.  Constitutional:      General: She is not in acute distress.    Appearance: She is well-developed. She is obese. She is not toxic-appearing.     Comments: Pleasant, cooperative, alert female.  No acute cardiorespiratory distress.  She is appears very uncomfortable from right sided pain and right knee pain. Patient's cousin was present during entire visit.  HENT:     Head: Normocephalic and atraumatic.     Comments: No facial ttt    Nose: Nose normal.  Eyes:     General: Vision grossly intact. No visual field deficit or scleral icterus.    Extraocular Movements: Extraocular movements intact.     Right eye: Normal extraocular motion.     Left eye: Normal extraocular motion.     Conjunctiva/sclera: Conjunctivae normal.     Right eye: No hemorrhage.    Left eye: No hemorrhage.    Pupils: Pupils are equal, round, and reactive to light.  Neck:     Musculoskeletal: Normal range of motion and neck supple.     Comments: Nontender over C-spine spinous processes Cardiovascular:     Rate and Rhythm: Normal rate and regular rhythm.     Comments: No cords or tenderness over calves bilaterally.  Negative Homans sign bilaterally Pulmonary:     Effort: Pulmonary effort is normal.     Breath sounds: Normal breath sounds. No decreased breath sounds.  Chest:    Abdominal:     Tenderness: There is no abdominal tenderness. There is no guarding.     Comments: Abdomen very obese, soft, no deep tenderness.  No masses guarding or rebound felt.  Musculoskeletal:     Right shoulder: Normal. She exhibits no bony tenderness.     Right elbow: Normal.    Right wrist: Normal.     Right hip: She exhibits tenderness and bony tenderness. She exhibits normal range of motion and no laceration.     Right  knee: She exhibits swelling, ecchymosis and bony tenderness. She exhibits normal range of motion and no laceration. Tenderness found.     Cervical back: Normal.     Thoracic back: Normal.     Lumbar back: She exhibits tenderness and bony tenderness. She exhibits no deformity.       Back:     Right hand: Normal.     Right lower leg: 1+ Pitting Edema present.     Left lower leg: 1+ Pitting Edema present.     Comments: Right knee: Diffusely tender.  Exam difficult because of obesity and pain.  No definite instability.  Skin:    General: Skin is warm and dry.     Capillary Refill: Capillary refill takes less than 2 seconds.  Neurological:     General: No focal deficit present.     Mental Status: She is alert and oriented to person, place, and time.     Cranial Nerves: No cranial nerve deficit.     Sensory: No sensory deficit.     Motor: No weakness.  Psychiatric:        Behavior: Behavior normal.    1:00 PM.  History taken and patient examined.  Will give Toradol 60 mg IM for pain relief.  I ordered x-rays of areas that have particular bony tenderness to rule out fracture or dislocation.  UC Treatments / Results  Labs (all labs ordered are listed, but only abnormal results are displayed) Labs Reviewed - No data to display  EKG None  Radiology Dg Ribs Unilateral W/chest Right  Result Date: 03/19/2018 CLINICAL DATA:  Right lower anterior rib pain following a fall 3 days ago. EXAM: RIGHT RIBS AND CHEST - 3+ VIEW COMPARISON:  Chest radiographs dated 02/14/2018. FINDINGS: Normal sized heart. Clear  lungs. Mild diffuse peribronchial thickening without significant change. Thoracic spine degenerative changes. No rib fracture or pneumothorax seen. There is some limitation to visualization of the ribs due to the patient's body habitus. IMPRESSION: 1. No rib fracture seen. 2. Stable mild chronic bronchitic changes. Electronically Signed   By: Beckie Salts M.D.   On: 03/19/2018 13:55   Dg Lumbar  Spine 2-3 Views  Result Date: 03/19/2018 CLINICAL DATA:  Low back pain following a fall 3 days ago. EXAM: LUMBAR SPINE - 2-3 VIEW COMPARISON:  None. FINDINGS: Five non-rib-bearing lumbar vertebrae. Mild anterior spur formation at multiple levels of the lumbar and lower thoracic spine. Mild posterior spur formation at the L4-5 and T11-12 levels. Mild facet degenerative changes in the mid and lower lumbar spine. No fractures, pars defects or subluxations. IMPRESSION: 1. No fracture or subluxation. 2. Mild degenerative changes. Electronically Signed   By: Beckie Salts M.D.   On: 03/19/2018 13:59   Dg Knee Complete 4 Views Right  Result Date: 03/19/2018 CLINICAL DATA:  Right knee pain following a fall 3 days ago. EXAM: RIGHT KNEE - COMPLETE 4+ VIEW COMPARISON:  06/06/2012. FINDINGS: Marked medial joint space narrowing with progression. Moderate medial and lateral spur formation. Marked patellofemoral spur formation and joint space narrowing. No fracture, dislocation or effusion seen. IMPRESSION: Tricompartmental degenerative changes with progression. No fracture. Electronically Signed   By: Beckie Salts M.D.   On: 03/19/2018 13:53   Dg Hip Unilat W Or Wo Pelvis 2-3 Views Right  Result Date: 03/19/2018 CLINICAL DATA:  Right hip pain following a fall 3 days ago. EXAM: DG HIP (WITH OR WITHOUT PELVIS) 2-3V RIGHT COMPARISON:  None. FINDINGS: Mild right femoral head and neck junction spur formation. No fracture or dislocation seen. Lower lumbar spine degenerative changes. IMPRESSION: 1. No fracture or dislocation. 2. Mild right hip degenerative changes. 3. Lower lumbar spine degenerative changes. Electronically Signed   By: Beckie Salts M.D.   On: 03/19/2018 13:57    Procedures Procedures (including critical care time)  Medications Ordered in UC Medications  ketorolac (TORADOL) injection 60 mg (60 mg Intramuscular Given 03/19/18 1252)    Initial Impression / Assessment and Plan / UC Course  I have  reviewed the triage vital signs and the nursing notes.  Pertinent labs & imaging results that were available during my care of the patient were reviewed by me and considered in my medical decision making (see chart for details).  Clinical Course as of Mar 20 1415  Fri Mar 19, 2018  1400 DG Hip Unilat W or Wo Pelvis 2-3 Views Right [DM]    Clinical Course User Index [DM] Lajean Manes, MD     Final Clinical Impressions(s) / UC Diagnoses   Final diagnoses:  Contusion of right knee, initial encounter  Contusion of right hip and thigh, initial encounter  Contusion of ribs, right, initial encounter  Lumbar contusion, initial encounter  Fall, initial encounter     Discharge Instructions     X-rays of right ribs, low back, right hip, right knee show no fractures or dislocations. The treatment is alternating ice and heat.  Ace bandage right knee. Relative rest, but it is important to move around periodically. You mentioned aquatic therapy, which might be beneficial.  It would be best for your PCP or orthopedist to order this and monitor progress ongoing.  Therefore, follow-up with your PCP within 4 to 7 days to recheck and possibly order aquatic therapy or other physical therapy. Here in urgent  care, we gave you a shot of Toradol which helped your pain. Today, I prescribed to prescriptions: Meloxicam twice a day with food for pain.  Flexeril as a muscle relaxant, 1 at bedtime.  Prescription sent to your pharmacy. Please read attached instruction sheet on contusion.    ED Prescriptions    Medication Sig Dispense Auth. Provider   meloxicam (MOBIC) 7.5 MG tablet Take 1 twice a day as needed for pain. Take with food. (Do not take with any other NSAID.) 20 tablet Lajean Manes, MD   cyclobenzaprine (FLEXERIL) 10 MG tablet Take 1 tablet (10 mg total) by mouth at bedtime. For muscle relaxant 10 tablet Lajean Manes, MD     Ace bandage applied right knee.   Lajean Manes, MD 03/19/18  [phone]

## 2018-03-19 NOTE — Discharge Instructions (Addendum)
X-rays of right ribs, low back, right hip, right knee show no fractures or dislocations. The treatment is alternating ice and heat.  Ace bandage right knee. Relative rest, but it is important to move around periodically. You mentioned aquatic therapy, which might be beneficial.  It would be best for your PCP or orthopedist to order this and monitor progress ongoing.  Therefore, follow-up with your PCP within 4 to 7 days to recheck and possibly order aquatic therapy or other physical therapy. Here in urgent care, we gave you a shot of Toradol which helped your pain. Today, I prescribed to prescriptions: Meloxicam twice a day with food for pain.  Flexeril as a muscle relaxant, 1 at bedtime.  Prescription sent to your pharmacy. Please read attached instruction sheet on contusion.

## 2018-03-19 NOTE — ED Triage Notes (Signed)
Pt was coming down stairs on Tuesday, and shoes caught on bricks, and pt face planted and fell on right side.  Right hand wrist, shoulder, hip and knee pain.  As well as abdominal pain.

## 2018-07-27 ENCOUNTER — Emergency Department (INDEPENDENT_AMBULATORY_CARE_PROVIDER_SITE_OTHER)
Admission: EM | Admit: 2018-07-27 | Discharge: 2018-07-27 | Disposition: A | Discharge status: Home / Self Care | Payer: Medicare HMO | Source: Home / Self Care | Attending: Family Medicine | Admitting: Family Medicine

## 2018-07-27 ENCOUNTER — Emergency Department (INDEPENDENT_AMBULATORY_CARE_PROVIDER_SITE_OTHER): Payer: Medicare HMO

## 2018-07-27 ENCOUNTER — Other Ambulatory Visit: Payer: Self-pay

## 2018-07-27 DIAGNOSIS — M62838 Other muscle spasm: Secondary | ICD-10-CM | POA: Diagnosis not present

## 2018-07-27 DIAGNOSIS — M542 Cervicalgia: Secondary | ICD-10-CM

## 2018-07-27 DIAGNOSIS — N83292 Other ovarian cyst, left side: Secondary | ICD-10-CM

## 2018-07-27 DIAGNOSIS — S76011A Strain of muscle, fascia and tendon of right hip, initial encounter: Secondary | ICD-10-CM | POA: Diagnosis not present

## 2018-07-27 LAB — POCT URINALYSIS DIP (MANUAL ENTRY)
Bilirubin, UA: NEGATIVE
Blood, UA: NEGATIVE
Glucose, UA: NEGATIVE mg/dL
Ketones, POC UA: NEGATIVE mg/dL
Leukocytes, UA: NEGATIVE
Nitrite, UA: NEGATIVE
Protein Ur, POC: NEGATIVE mg/dL
Spec Grav, UA: >=1.03 — AB (ref 1.010–1.025)
Urobilinogen, UA: 0.2 E.U./dL
pH, UA: 5.5 (ref 5.0–8.0)

## 2018-07-27 MED ORDER — HYDROCODONE-ACETAMINOPHEN 5-325 MG PO TABS: ORAL_TABLET | ORAL | 0 refills | Status: DC

## 2018-07-27 NOTE — ED Triage Notes (Signed)
Pt has been having right flank pain for about 2 months.  She felt a "pop" on Saturday.  Has been having burning and frequency for about 6 months.

## 2018-07-27 NOTE — Discharge Instructions (Addendum)
May take Ibuprofen 200mg , 4 tabs every 8 hours with food.  Apply ice pack for 20 to 30 minutes, 3 to 4 times daily  Continue until pain decreases.  Begin range of motion and stretching exercises as tolerated.

## 2018-07-27 NOTE — ED Provider Notes (Signed)
[facility]    CSN: [id number] Arrival date & time: 07/27/18  0915     History   Chief Complaint Chief Complaint  Patient presents with   Flank Pain    HPI Brittney Murray is a 58 y.o. female.   Patient presents with three problems: 1)  About two months ago she developed vague pain in her right groin and lower abdomen without preceding injury.  The pain has persisted, worse with flexing her right hip, climbing stairs, exiting a car etc.  Three days ago she fest and heard a "pop" in the area, with subsequent increased pain.  She has pain when walking and when repositioning in bed.  The pain awakens her at night.  She has felt no mass in her right lower abdomen, and denies changes in bowel movements.   2)  She has been having mild urinary frequency, urgency, and occasional nocturia for about 6 months.  She had a UTI in February treated with Keflex.  She denies fevers, chills, and sweats  3)  During the past 4 to 5 days she has had pain in her right neck, radiating to her right shoulder, worse when she rotates her head to the right, and flexing her head laterally to the left.  She admits that she has been sleeping in an awkward position at night because of her right groin pain.  She denies neck injury  The history is provided by the patient.    Past Medical History:  Diagnosis Date   Asthma    GERD (gastroesophageal reflux disease)    Hypertension    Kidney stones    Sleep apnea     Patient Active Problem List   Diagnosis Date Noted   HYPERTENSION 02/04/2010   ASTHMA 02/04/2010    Past Surgical History:  Procedure Laterality Date   GYN surgery     OVARY SURGERY     PARATHYROID EXPLORATION         Home Medications    Prior to Admission medications   Medication Sig Start Date End Date Taking? Authorizing Provider  albuterol (PROVENTIL HFA;VENTOLIN HFA) 108 (90 BASE) MCG/ACT inhaler Inhale 2 puffs into the lungs every 4 (four) hours as needed for  wheezing or shortness of breath. 04/24/14   Lattie Haw, MD  albuterol (PROVENTIL HFA;VENTOLIN HFA) 108 (90 Base) MCG/ACT inhaler Inhale 1-2 puffs into the lungs every 6 (six) hours as needed for wheezing or shortness of breath. 02/08/16   Lurene Shadow, PA-C  amLODipine (NORVASC) 10 MG tablet Take 10 mg by mouth daily.    [provider]  benzonatate (TESSALON) 100 MG capsule Take 1-2 capsules (100-200 mg total) by mouth every 8 (eight) hours. 02/14/18   Lurene Shadow, PA-C  cyclobenzaprine (FLEXERIL) 10 MG tablet Take 1 tablet (10 mg total) by mouth at bedtime. For muscle relaxant 03/19/18   Lajean Manes, MD  dicyclomine (BENTYL) 20 MG tablet Take 1 tablet (20 mg total) by mouth 2 (two) times daily. 09/09/15   Lurene Shadow, PA-C  HYDROcodone-acetaminophen (NORCO/VICODIN) 5-325 MG tablet Take one by mouth at bedtime as needed for pain.  May repeat in 4 to 6hr PRN. 07/27/18   Lattie Haw, MD  ipratropium (ATROVENT) 0.06 % nasal spray Place 2 sprays into both nostrils 4 (four) times daily. 02/13/16   Lurene Shadow, PA-C  lisinopril (PRINIVIL,ZESTRIL) 10 MG tablet Take 20 mg by mouth daily.     [provider]  meloxicam (MOBIC) 7.5  MG tablet Take 1 twice a day as needed for pain. Take with food. (Do not take with any other NSAID.) 03/19/18   Lajean ManesMassey, David, MD  mometasone (NASONEX) 50 MCG/ACT nasal spray Place 2 sprays into the nose daily.    [provider]  Pantoprazole Sodium (PROTONIX PO) Take by mouth.    [provider]  potassium citrate (UROCIT-K) 10 MEQ (1080 MG) SR tablet Take 10 mEq by mouth 3 (three) times daily with meals.    [provider]  spironolactone (ALDACTONE) 100 MG tablet Take 100 mg by mouth daily.    [provider]  traMADol-acetaminophen (ULTRACET) 37.5-325 MG per tablet 1 or 2 every 6 hours as needed for moderate-severe pain.  Caution: May cause drowsiness 06/01/14   Lajean ManesMassey, David, MD    Family History Family  History  Problem Relation Age of Onset   Diabetes Mother    Alzheimer's disease Mother    Heart failure Father    COPD Father     Social History Social History   Tobacco Use   Smoking status: Never Smoker   Smokeless tobacco: Never Used  Substance Use Topics   Alcohol use: No   Drug use: No     Allergies   Bee venom; Doxycycline; Penicillins; Shrimp [shellfish allergy]; and Sulfonamide derivatives   Review of Systems Review of Systems  Constitutional: Positive for activity change. Negative for appetite change, chills, diaphoresis, fatigue, fever and unexpected weight change.  HENT: Negative.   Eyes: Negative.   Respiratory: Negative.   Gastrointestinal: Positive for abdominal pain. Negative for abdominal distention, blood in stool, constipation, diarrhea, nausea and vomiting.  Genitourinary: Positive for dysuria, frequency and urgency. Negative for flank pain, hematuria, pelvic pain, vaginal bleeding, vaginal discharge and vaginal pain.  Musculoskeletal: Positive for neck pain.       Right groin pain  Skin: Negative.   Neurological: Negative.      Physical Exam Triage Vital Signs ED Triage Vitals  Enc Vitals Group     BP 07/27/18 0952 115/75     Pulse Rate 07/27/18 0952 99     Resp 07/27/18 0952 20     Temp 07/27/18 0952 97.8 F (36.6 C)     Temp Source 07/27/18 0952 Tympanic     SpO2 07/27/18 0952 98 %     Weight 07/27/18 0953 (!) 414 lb (187.8 kg)     Height 07/27/18 0953 5' (1.524 m)     Head Circumference --      Peak Flow --      Pain Score 07/27/18 0953 2     Pain Loc --      Pain Edu? --      Excl. in GC? --    No data found.  Updated Vital Signs BP 115/75 (BP Location: Right Arm)    Pulse 99    Temp 97.8 F (36.6 C) (Tympanic)    Resp 20    Ht 5' (1.524 m)    Wt (!) 187.8 kg    SpO2 98%    BMI 80.85 kg/m   Visual Acuity Right Eye Distance:   Left Eye Distance:   Bilateral Distance:    Right Eye Near:   Left Eye Near:      Bilateral Near:     Physical Exam Vitals signs and nursing note reviewed.  Constitutional:      General: She is not in acute distress.    Appearance: She is not toxic-appearing.     Comments:  Patient is morbidly obese  HENT:     Head: Normocephalic.     Right Ear: External ear normal.     Left Ear: External ear normal.     Nose: Nose normal.     Mouth/Throat:     Pharynx: Oropharynx is clear.  Eyes:     Conjunctiva/sclera: Conjunctivae normal.     Pupils: Pupils are equal, round, and reactive to light.  Neck:     Musculoskeletal: Normal range of motion and neck supple. Muscular tenderness present. No neck rigidity.     Thyroid: No thyromegaly.      Comments: There is tenderness to palpation over the right occipital and trapezius area as noted on diagram.  Cardiovascular:     Rate and Rhythm: Normal rate.     Heart sounds: Normal heart sounds.  Pulmonary:     Breath sounds: Normal breath sounds.  Abdominal:     General: Bowel sounds are normal. There is no distension.     Palpations: Abdomen is soft. There is no mass.     Tenderness: There is abdominal tenderness in the right lower quadrant. There is no guarding.       Comments: Abdomen difficulty to examine because of body habitus and massive panniculus.  There is tenderness to palpation right lower quadrant without rebound tenderness.  Musculoskeletal:     Right hip: She exhibits decreased range of motion and tenderness.     Right lower leg: No edema.     Left lower leg: No edema.       Legs:     Comments: There is distinct tenderness to palpation over the right inguinal ligament.  Pain is elicited by resisted flexion of the right hip.  Lymphadenopathy:     Cervical: No cervical adenopathy.  Skin:    General: Skin is warm and dry.     Findings: No rash.  Neurological:     Mental Status: She is alert.      UC Treatments / Results  Labs (all labs ordered are listed, but only abnormal results are  displayed) Labs Reviewed  POCT URINALYSIS DIP (MANUAL ENTRY) - Abnormal; Notable for the following components:      Result Value   Spec Grav, UA >=1.030 (*)    All other components within normal limits  URINE CULTURE    EKG None  Radiology Ct Abdomen Pelvis Wo Contrast  Result Date: 07/27/2018 CLINICAL DATA:  Right lower quadrant abdominal pain and urinary frequency for 2 months. EXAM: CT ABDOMEN AND PELVIS WITHOUT CONTRAST TECHNIQUE: Multidetector CT imaging of the abdomen and pelvis was performed following the standard protocol without IV contrast. COMPARISON:  None. FINDINGS: Lower chest: The lung bases are clear of acute process. No pleural effusion or pulmonary lesions. The heart is normal in size. No pericardial effusion. A few scattered coronary artery calcifications are noted. The distal esophagus and aorta are unremarkable. A small hiatal hernia is noted. Hepatobiliary: No focal hepatic lesions are identified without contrast. No intrahepatic biliary dilatation. The gallbladder is grossly normal. No common bile duct dilatation. Pancreas: No obvious mass or inflammation without contrast. No ductal dilatation. Spleen: Normal size.  No focal lesions. Adrenals/Urinary Tract: The adrenal glands are unremarkable. No renal, ureteral or bladder calculi or mass is identified without IV contrast. I suspect there are bilateral parapelvic renal cysts. Mild UPJ obstructions are also possible. Stomach/Bowel: The stomach, duodenum, small bowel and colon are grossly normal without evidence for acute inflammatory process, mass lesion or obstructive abnormality.  The terminal ileum and appendix are normal. Vascular/Lymphatic: The aorta is normal in caliber. Scattered atheroscerlotic calcifications. No mesenteric of retroperitoneal mass or adenopathy. Small scattered lymph nodes are noted. Reproductive: The uterus and ovaries are unremarkable. There is a simple appearing 2 cm cyst associated with the left ovary.  Other: No significant free pelvic fluid collections, pelvic adenopathy or abdominal wall hernia. No inguinal hernia or inguinal mass. Musculoskeletal: No significant bony findings. There are age advanced degenerative changes involving the lower thoracic spine and also L4-5 and L5-S1. IMPRESSION: 1. Exam limited by body habitus and lack of IV contrast. 2. No renal, ureteral or bladder calculi or mass. 3. Probable bilateral parapelvic renal cysts. Mild UPJ obstructions are also possible. 4. Simple appearing 2 cm left ovarian cyst. Electronically Signed   By: Rudie Meyer M.D.   On: 07/27/2018 13:20   Dg Cervical Spine Complete  Result Date: 07/27/2018 CLINICAL DATA:  Neck pain for several days, no known injury, initial encounter EXAM: CERVICAL SPINE - COMPLETE 4+ VIEW COMPARISON:  None. FINDINGS: There is no evidence of cervical spine fracture or prevertebral soft tissue swelling. Alignment is normal. No other significant bone abnormalities are identified. IMPRESSION: No acute abnormality noted. Electronically Signed   By: Alcide Clever M.D.   On: 07/27/2018 13:20    Procedures Procedures (including critical care time)  Medications Ordered in UC Medications - No data to display  Initial Impression / Assessment and Plan / UC Course  I have reviewed the triage vital signs and the nursing notes.  Pertinent labs & imaging results that were available during my care of the patient were reviewed by me and considered in my medical decision making (see chart for details).    Unremarkable CT scan and negative C-spine reassuring.  Note negative urinalysis.  Will obtain urine culture (will treat if positive) Followup with Dr. Rodney Langton or Dr. Clement[facility] ([facility]) if not improving about two weeks.   Final Clinical Impressions(s) / UC Diagnoses   Final diagnoses:  Strain of hip flexor, right, initial encounter  Neck muscle spasm     Discharge Instructions     May take  Ibuprofen , 4 tabs every 8 hours with food.  Apply ice pack for 20 to 30 minutes, 3 to 4 times daily  Continue until pain decreases.  Begin range of motion and stretching exercises as tolerated (both neck and right hip)    ED Prescriptions    Medication Sig Dispense Auth. Provider   HYDROcodone-acetaminophen (NORCO/VICODIN) 5-325 MG tablet Take one by mouth at bedtime as needed for pain.  May repeat in 4 to 6hr PRN. 10 tablet Lattie Haw, MD        Lattie Haw, MD 07/28/18 1116

## 2018-07-29 ENCOUNTER — Telehealth: Payer: Self-pay

## 2018-07-29 LAB — URINE CULTURE
MICRO NUMBER:: 528399
Result:: NO GROWTH
SPECIMEN QUALITY:: ADEQUATE

## 2018-07-29 NOTE — Telephone Encounter (Signed)
Spoke with patient, still having pain.  Encouraged her to follow up with Denyse Amass or T.  Gave negative UCX results.

## 2018-08-18 ENCOUNTER — Encounter: Payer: Medicare HMO | Admitting: Family Medicine

## 2018-08-18 NOTE — Progress Notes (Deleted)
Subjective:    CC: ***  HPI: ***  Patient was seen in urgent care on June 2.  At that time she was having anterior hip and groin pain.  Due to some other issues she had CT abdomen pelvis without contrast.  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  He was prescribed NSAIDs and hydrocodone and advised to do some hip flexor strain exercises.  Additionally on the visit she complained of right-sided neck pain.  She was not to have any neck strain after x-ray C-spine was also unremarkable.  She was advised to follow-up with me if not improved.  In the interim she notes that ***  Past medical history, Surgical history, Family history not pertinant except as noted below, Social history, Allergies, and medications have been entered into the medical record, reviewed, and no changes needed.   Review of Systems: No headache, visual changes, nausea, vomiting, diarrhea, constipation, dizziness, abdominal pain, skin rash, fevers, chills, night sweats, weight loss, swollen lymph nodes, body aches, joint swelling, muscle aches, chest pain, shortness of breath, mood changes, visual or auditory hallucinations.   Objective:   There were no vitals filed for this visit. General: Well Developed, well nourished, and in no acute distress.  Neuro/Psych: Alert and oriented x3, extra-ocular muscles intact, able to move all 4 extremities, sensation grossly intact. Skin: Warm and dry, no rashes noted.  Respiratory: Not using accessory muscles, speaking in full sentences, trachea midline.  Cardiovascular: Pulses palpable, no extremity edema. Abdomen: Does not appear distended. MSK: ***  Lab and Radiology Results Ct Abdomen Pelvis Wo Contrast  Result Date: 07/27/2018 CLINICAL DATA:  Right lower quadrant abdominal pain and urinary frequency for 2 months. EXAM: CT ABDOMEN AND PELVIS WITHOUT CONTRAST TECHNIQUE: Multidetector CT imaging of the abdomen and pelvis was performed  following the standard protocol without IV contrast. COMPARISON:  None. FINDINGS: Lower chest: The lung bases are clear of acute process. No pleural effusion or pulmonary lesions. The heart is normal in size. No pericardial effusion. A few scattered coronary artery calcifications are noted. The distal esophagus and aorta are unremarkable. A small hiatal hernia is noted. Hepatobiliary: No focal hepatic lesions are identified without contrast. No intrahepatic biliary dilatation. The gallbladder is grossly normal. No common bile duct dilatation. Pancreas: No obvious mass or inflammation without contrast. No ductal dilatation. Spleen: Normal size.  No focal lesions. Adrenals/Urinary Tract: The adrenal glands are unremarkable. No renal, ureteral or bladder calculi or mass is identified without IV contrast. I suspect there are bilateral parapelvic renal cysts. Mild UPJ obstructions are also possible. Stomach/Bowel: The stomach, duodenum, small bowel and colon are grossly normal without evidence for acute inflammatory process, mass lesion or obstructive abnormality. The terminal ileum and appendix are normal. Vascular/Lymphatic: The aorta is normal in caliber. Scattered atheroscerlotic calcifications. No mesenteric of retroperitoneal mass or adenopathy. Small scattered lymph nodes are noted. Reproductive: The uterus and ovaries are unremarkable. There is a simple appearing 2 cm cyst associated with the left ovary. Other: No significant free pelvic fluid collections, pelvic adenopathy or abdominal wall hernia. No inguinal hernia or inguinal mass. Musculoskeletal: No significant bony findings. There are age advanced degenerative changes involving the lower thoracic spine and also L4-5 and L5-S1. IMPRESSION: 1. Exam limited by body habitus and lack of IV contrast. 2. No renal, ureteral or bladder calculi or mass. 3. Probable bilateral parapelvic renal cysts. Mild UPJ obstructions are also possible. 4. Simple appearing 2 cm  left  ovarian cyst. Electronically Signed   By: Rudie MeyerP.  Gallerani M.D.   On: 07/27/2018 13:20   Dg Cervical Spine Complete  Result Date: 07/27/2018 CLINICAL DATA:  Neck pain for several days, no known injury, initial encounter EXAM: CERVICAL SPINE - COMPLETE 4+ VIEW COMPARISON:  None. FINDINGS: There is no evidence of cervical spine fracture or prevertebral soft tissue swelling. Alignment is normal. No other significant bone abnormalities are identified. IMPRESSION: No acute abnormality noted. Electronically Signed   By: Alcide CleverMark  Lukens M.D.   On: 07/27/2018 13:20     Impression and Recommendations:    Assessment and Plan: 58 y.o. female with ***.  PDMP not reviewed this encounter. No orders of the defined types were placed in this encounter.  No orders of the defined types were placed in this encounter.   Discussed warning signs or symptoms. Please see discharge instructions. Patient expresses understanding.

## 2019-04-20 DIAGNOSIS — E119 Type 2 diabetes mellitus without complications: Secondary | ICD-10-CM | POA: Insufficient documentation

## 2020-02-06 IMAGING — DX CERVICAL SPINE - COMPLETE 4+ VIEW
6 series · 6 of 6 positions shown · non-contrast
Comparison: None.

CLINICAL DATA: Neck pain for several days, no known injury, initial
encounter

EXAM:
CERVICAL SPINE - COMPLETE 4+ VIEW

[c-spine lat]
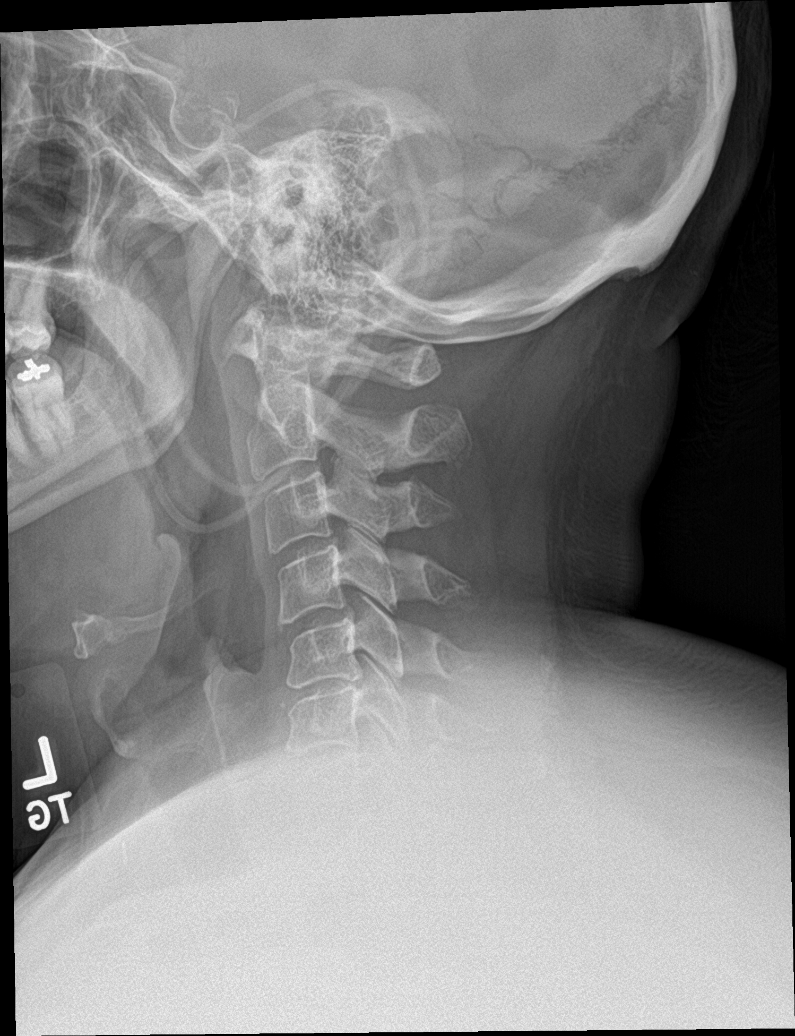

[c-spine obl (1 of 2)]
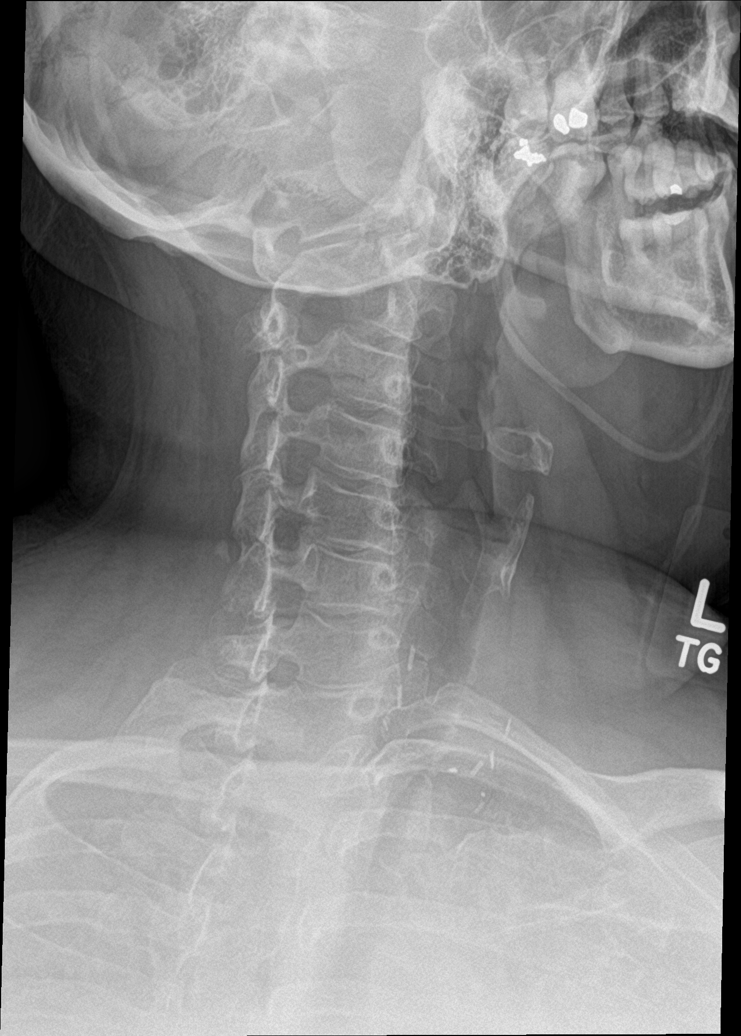

[c-spine obl (2 of 2)]
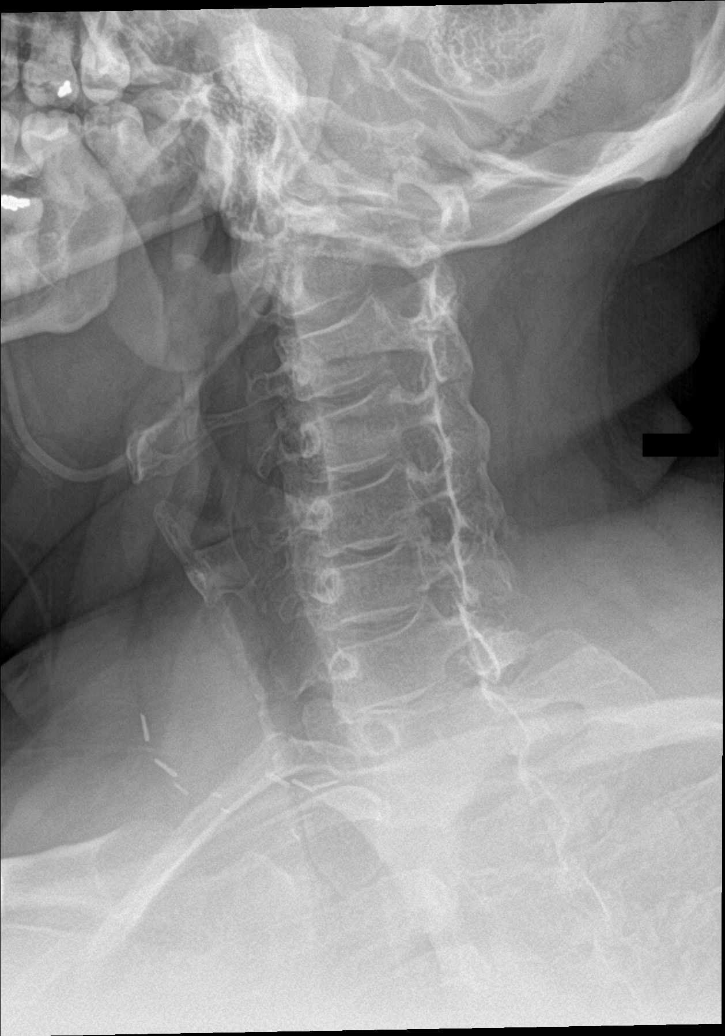

[c-spine ap]
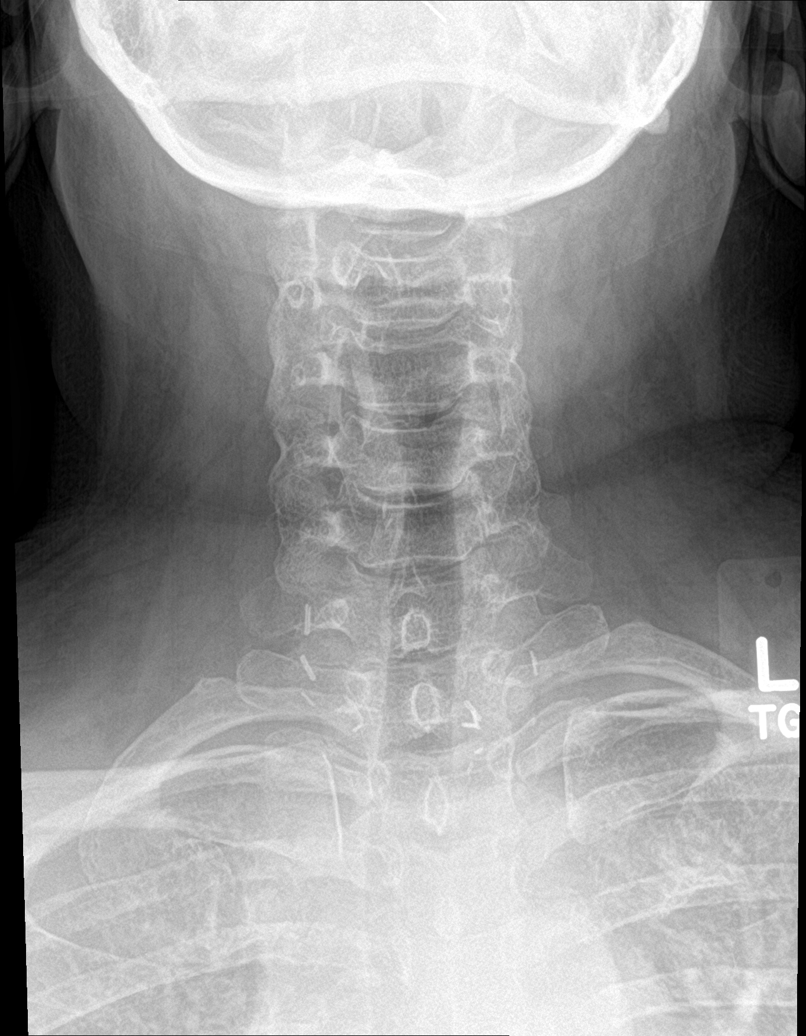

[c-spine open mouth]
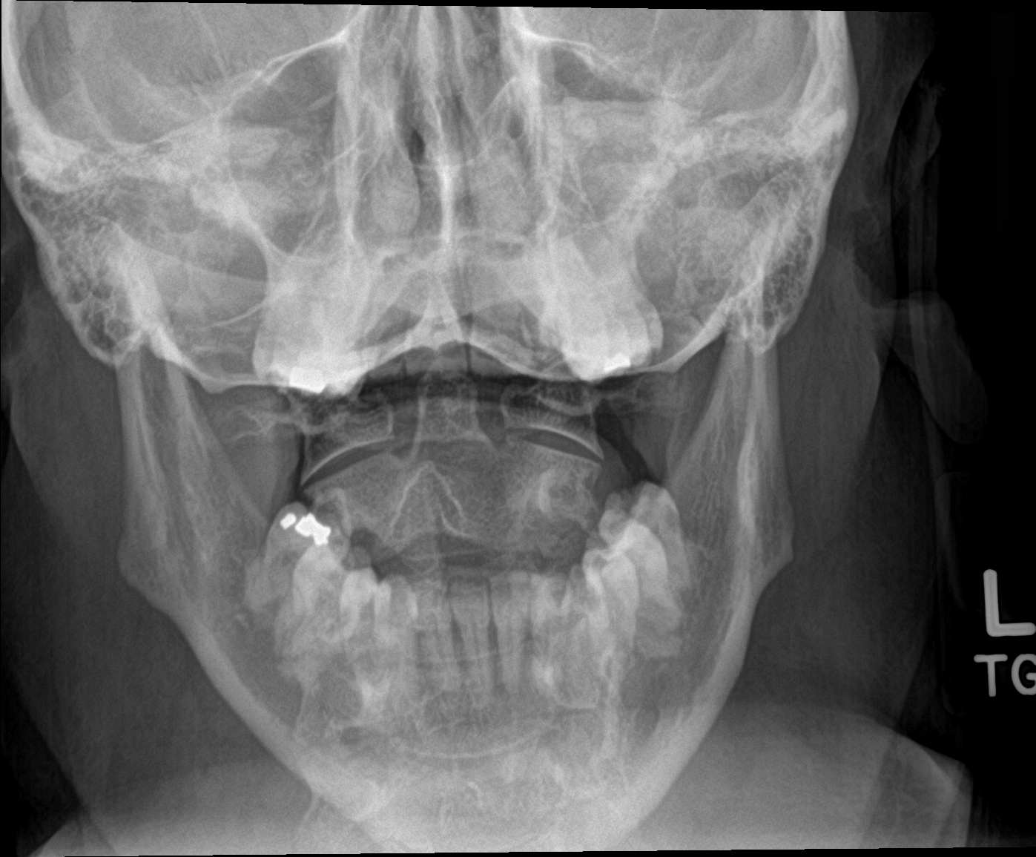

[c-spine swimmers]
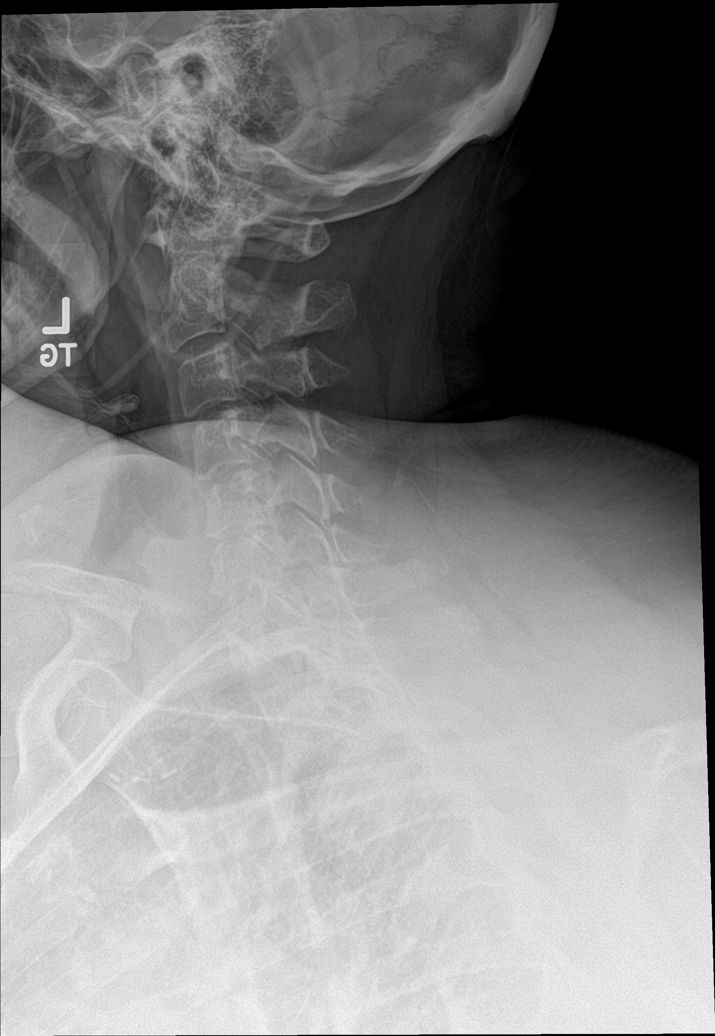

[6 of 6 positions shown; findings below may reference images not displayed]

FINDINGS: There is no evidence of cervical spine fracture or prevertebral soft
tissue swelling. Alignment is normal. No other significant bone
abnormalities are identified.
IMPRESSION: No acute abnormality noted.

## 2021-01-31 DIAGNOSIS — Z20822 Contact with and (suspected) exposure to covid-19: Secondary | ICD-10-CM | POA: Diagnosis not present

## 2021-01-31 DIAGNOSIS — R4589 Other symptoms and signs involving emotional state: Secondary | ICD-10-CM | POA: Diagnosis not present

## 2021-01-31 DIAGNOSIS — R32 Unspecified urinary incontinence: Secondary | ICD-10-CM | POA: Diagnosis not present

## 2021-01-31 DIAGNOSIS — K219 Gastro-esophageal reflux disease without esophagitis: Secondary | ICD-10-CM | POA: Diagnosis not present

## 2021-01-31 DIAGNOSIS — G4730 Sleep apnea, unspecified: Secondary | ICD-10-CM | POA: Diagnosis not present

## 2021-01-31 DIAGNOSIS — R0981 Nasal congestion: Secondary | ICD-10-CM | POA: Diagnosis not present

## 2021-03-14 ENCOUNTER — Telehealth: Payer: Medicare Other | Admitting: Family Medicine

## 2021-03-14 DIAGNOSIS — R112 Nausea with vomiting, unspecified: Secondary | ICD-10-CM | POA: Diagnosis not present

## 2021-03-14 DIAGNOSIS — R197 Diarrhea, unspecified: Secondary | ICD-10-CM | POA: Diagnosis not present

## 2021-03-14 MED ORDER — ONDANSETRON HCL 4 MG PO TABS: 4.0000 mg | ORAL_TABLET | Freq: Three times a day (TID) | ORAL | 0 refills | Status: DC | PRN

## 2021-03-14 NOTE — Progress Notes (Signed)
Virtual Visit Consent   Brittney Murray, you are scheduled for a virtual visit with a [facility] provider today.     Just as with appointments in the office, your consent must be obtained to participate.  Your consent will be active for this visit and any virtual visit you may have with one of our providers in the next 365 days.     If you have a MyChart account, a copy of this consent can be sent to you electronically.  All virtual visits are billed to your insurance company just like a traditional visit in the office.    As this is a virtual visit, video technology does not allow for your provider to perform a traditional examination.  This may limit your provider's ability to fully assess your condition.  If your provider identifies any concerns that need to be evaluated in person or the need to arrange testing (such as labs, EKG, etc.), we will make arrangements to do so.     Although advances in technology are sophisticated, we cannot ensure that it will always work on either your end or our end.  If the connection with a video visit is poor, the visit may have to be switched to a telephone visit.  With either a video or telephone visit, we are not always able to ensure that we have a secure connection.     I need to obtain your verbal consent now.   Are you willing to proceed with your visit today?    Brittney Murray has provided verbal consent on 03/14/2021 for a virtual visit (video or telephone).   Brittney FinnerHannah M Brinley Rosete, NP   Date: 03/14/2021 9:40 AM   Virtual Visit via Video Note   I, Brittney FinnerHannah M Alphia Behanna, connected with  Brittney Murray Duque  ([id number], [birth year]) on 03/14/21 at  9:30 AM EST by a video-enabled telemedicine application and verified that I am speaking with the correct person using two identifiers.  Location: Patient: Virtual Visit Location Patient: Home Provider: Virtual Visit Location Provider: Home Office   I discussed the limitations of evaluation and management by telemedicine and  the availability of in person appointments. The patient expressed understanding and agreed to proceed.    History of Present Illness: Brittney Murray Rhew is a 61 y.o. who identifies as a female who was assigned female at birth, and is being seen today for several symptoms including: sore throat, diarrhea and vomiting, lingering nausea, aches, food tasting funny, mild congestion at times, and fatigue. Reports testing negative for COVID several times over the last 2 weeks. Didn't get into PCP as she was having trouble leaving the house due to upset stomach a week back. Has tried some OTC for diarrhea and nausea with limited relief.  Limited exposure- is home a lot- no know flu or covid contacts. Reports eating out for take out-but no GI issues immediately following that.     Problems:  Patient Active Problem List   Diagnosis Date Noted   HYPERTENSION 02/04/2010   ASTHMA 02/04/2010    Allergies:  Allergies  Allergen Reactions   Bee Venom    Doxycycline    Penicillins     Has taken cephalexin without problems   Shrimp [Shellfish Allergy]    Sulfonamide Derivatives    Medications:  Current Outpatient Medications:    albuterol (PROVENTIL HFA;VENTOLIN HFA) 108 (90 BASE) MCG/ACT inhaler, Inhale 2 puffs into the lungs every 4 (four) hours as needed for wheezing or shortness of breath., Disp: 1 Inhaler, Rfl:  0   albuterol (PROVENTIL HFA;VENTOLIN HFA) 108 (90 Base) MCG/ACT inhaler, Inhale 1-2 puffs into the lungs every 6 (six) hours as needed for wheezing or shortness of breath., Disp: 1 Inhaler, Rfl: 0   amLODipine (NORVASC) 10 MG tablet, Take 10 mg by mouth daily., Disp: , Rfl:    benzonatate (TESSALON) 100 MG capsule, Take 1-2 capsules (100-200 mg total) by mouth every 8 (eight) hours., Disp: 21 capsule, Rfl: 0   cyclobenzaprine (FLEXERIL) 10 MG tablet, Take 1 tablet (10 mg total) by mouth at bedtime. For muscle relaxant, Disp: 10 tablet, Rfl: 0   dicyclomine (BENTYL) 20 MG tablet, Take 1 tablet  (20 mg total) by mouth 2 (two) times daily., Disp: 20 tablet, Rfl: 0   HYDROcodone-acetaminophen (NORCO/VICODIN) 5-325 MG tablet, Take one by mouth at bedtime as needed for pain.  May repeat in 4 to 6hr PRN., Disp: 10 tablet, Rfl: 0   ipratropium (ATROVENT) 0.06 % nasal spray, Place 2 sprays into both nostrils 4 (four) times daily., Disp: 15 mL, Rfl: 12   lisinopril (PRINIVIL,ZESTRIL) 10 MG tablet, Take 20 mg by mouth daily. , Disp: , Rfl:    meloxicam (MOBIC) 7.5 MG tablet, Take 1 twice a day as needed for pain. Take with food. (Do not take with any other NSAID.), Disp: 20 tablet, Rfl: 0   mometasone (NASONEX) 50 MCG/ACT nasal spray, Place 2 sprays into the nose daily., Disp: , Rfl:    Pantoprazole Sodium (PROTONIX PO), Take by mouth., Disp: , Rfl:    potassium citrate (UROCIT-K) 10 MEQ (1080 MG) SR tablet, Take 10 mEq by mouth 3 (three) times daily with meals., Disp: , Rfl:    spironolactone (ALDACTONE) 100 MG tablet, Take 100 mg by mouth daily., Disp: , Rfl:    traMADol-acetaminophen (ULTRACET) 37.5-325 MG per tablet, 1 or 2 every 6 hours as needed for moderate-severe pain.  Caution: May cause drowsiness, Disp: 12 tablet, Rfl: 0  Observations/Objective: Patient is well-developed, well-nourished in no acute distress.  Resting comfortably  at home.  Head is normocephalic, atraumatic.  No labored breathing.  Speech is clear and coherent with logical content.  Patient is alert and oriented at baseline.    Assessment and Plan: 1. Nausea and vomiting, unspecified vomiting type  - ondansetron (ZOFRAN) 4 MG tablet; Take 1 tablet (4 mg total) by mouth every 8 (eight) hours as needed for nausea or vomiting.  Dispense: 15 tablet; Refill: 0  2. Diarrhea, unspecified type  - ondansetron (ZOFRAN) 4 MG tablet; Take 1 tablet (4 mg total) by mouth every 8 (eight) hours as needed for nausea or vomiting.  Dispense: 15 tablet; Refill: 0   -reports covid neg on HT (question false neg given symptoms and  duration) -advised on follow up with PCP given duration and on going symptoms. -advised on OTC tx for diarrhea as needed -will give zofran for nausea  -encouraged OTC measures on AVS    Reviewed side effects, risks and benefits of medication.    Patient acknowledged agreement and understanding of the plan.   I discussed the assessment and treatment plan with the patient. The patient was provided an opportunity to ask questions and all were answered. The patient agreed with the plan and demonstrated an understanding of the instructions.   The patient was advised to call back or seek an in-person evaluation if the symptoms worsen or if the condition fails to improve as anticipated.   The above assessment and management plan was discussed with the patient.  The patient verbalized understanding of and has agreed to the management plan. Patient is aware to call the clinic if symptoms persist or worsen. Patient is aware when to return to the clinic for a follow-up visit. Patient educated on when it is appropriate to go to the emergency department.    Follow Up Instructions: I discussed the assessment and treatment plan with the patient. The patient was provided an opportunity to ask questions and all were answered. The patient agreed with the plan and demonstrated an understanding of the instructions.  A copy of instructions were sent to the patient via MyChart unless otherwise noted below.   The patient was advised to call back or seek an in-person evaluation if the symptoms worsen or if the condition fails to improve as anticipated.  Time:  I spent 10 minutes with the patient via telehealth technology discussing the above problems/concerns.    Brittney Finner, NP

## 2021-03-14 NOTE — Patient Instructions (Addendum)
Ondansetron Injection What is this medication? ONDANSETRON (on DAN se tron) prevents nausea and vomiting from chemotherapy, radiation, or surgery. It works by blocking substances in the body that may cause nausea or vomiting. It belongs to a groupd of medications called antiemetics. This medicine may be used for other purposes; ask your health care provider or pharmacist if you have questions. COMMON BRAND NAME(S): Zofran, Zofran in Dextrose, Zofran Solution What should I tell my care team before I take this medication? They need to know if you have any of these conditions: Heart disease History of irregular heartbeat Liver disease Low levels of magnesium or potassium in the blood An unusual or allergic reaction to ondansetron, granisetron, other medications, foods, dyes, or preservatives Pregnant or trying to get pregnant Breast-feeding How should I use this medication? This medication is for infusion into a vein. It is given in a hospital or clinic. Talk to your care team regarding the use of this medication in children. Special care may be needed. Overdosage: If you think you have taken too much of this medicine contact a poison control center or emergency room at once. NOTE: This medicine is only for you. Do not share this medicine with others. What if I miss a dose? This does not apply. What may interact with this medication? Do not take this medication with any of the following: Apomorphine Certain medications for fungal infections like fluconazole, itraconazole, ketoconazole, posaconazole, voriconazole Cisapride Dronedarone Pimozide Thioridazine This medication may also interact with the following: Carbamazepine Certain medications for depression, anxiety, or psychotic disturbances Fentanyl Linezolid MAOIs like Carbex, Eldepryl, Marplan, Nardil, and Parnate Methylene blue (injected into a vein) Other medications that prolong the QT interval (cause an abnormal heart rhythm)  like dofetilide, ziprasidone Phenytoin Rifampicin Tramadol This list may not describe all possible interactions. Give your health care provider a list of all the medicines, herbs, non-prescription drugs, or dietary supplements you use. Also tell them if you smoke, drink alcohol, or use illegal drugs. Some items may interact with your medicine. What should I watch for while using this medication? Your condition will be monitored carefully while you are receiving this medication. What side effects may I notice from receiving this medication? Side effects that you should report to your care team as soon as possible: Allergic reactions--skin rash, itching, hives, swelling of the face, lips, tongue, or throat Bowel blockage--stomach cramping, unable to have a bowel movement or pass gas, loss of appetite, vomiting Chest pain (angina)--pain, pressure, or tightness in the chest, neck, back, or arms Heart rhythm changes--fast or irregular heartbeat, dizziness, feeling faint or lightheaded, chest pain, trouble breathing Irritability, confusion, fast or irregular heartbeat, muscle stiffness, twitching muscles, sweating, high fever, seizure, chills, vomiting, diarrhea, which may be signs of serotonin syndrome Side effects that usually do not require medical attention (report to your care team if they continue or are bothersome): Constipation Diarrhea General discomfort and fatigue Headache This list may not describe all possible side effects. Call your doctor for medical advice about side effects. You may report side effects to FDA at 1-800-FDA-1088. Where should I keep my medication? This medication is given in a hospital or clinic and will not be stored at home. NOTE: This sheet is a summary. It may not cover all possible information. If you have questions about this medicine, talk to your doctor, pharmacist, or health care provider.  2022 Elsevier/Gold Standard (2020-03-16 00:00:00) Diarrhea,  Adult Diarrhea is when you pass loose and watery poop (stool) often. Diarrhea  can make you feel weak and cause you to lose water in your body (get dehydrated). Losing water in your body can cause you to: Feel tired and thirsty. Have a dry mouth. Go pee (urinate) less often. Diarrhea often lasts 2-3 days. However, it can last longer if it is a sign of something more serious. It is important to treat your diarrhea as told by your doctor. Follow these instructions at home: Eating and drinking   Follow these instructions as told by your doctor: Take an ORS (oral rehydration solution). This is a drink that helps you replace fluids and minerals your body lost. It is sold at pharmacies and stores. Drink plenty of fluids, such as: Water. Ice chips. Diluted fruit juice. Low-calorie sports drinks. Milk, if you want. Avoid drinking fluids that have a lot of sugar or caffeine in them. Eat bland, easy-to-digest foods in small amounts as you are able. These foods include: Bananas. Applesauce. Rice. Low-fat (lean) meats. Toast. Crackers. Avoid alcohol. Avoid spicy or fatty foods.  Medicines Take over-the-counter and prescription medicines only as told by your doctor. If you were prescribed an antibiotic medicine, take it as told by your doctor. Do not stop using the antibiotic even if you start to feel better. General instructions  Wash your hands often using soap and water. If soap and water are not available, use a hand sanitizer. Others in your home should wash their hands as well. Hands should be washed: After using the toilet or changing a diaper. Before preparing, cooking, or serving food. While caring for a sick person. While visiting someone in a hospital. Drink enough fluid to keep your pee (urine) pale yellow. Rest at home while you get better. Take a warm bath to help with any burning or pain from having diarrhea. Watch your condition for any changes. Keep all follow-up visits  as told by your doctor. This is important. Contact a doctor if: You have a fever. Your diarrhea gets worse. You have new symptoms. You cannot keep fluids down. You feel light-headed or dizzy. You have a headache. You have muscle cramps. Get help right away if: You have chest pain. You feel very weak or you pass out (faint). You have bloody or black poop or poop that looks like tar. You have very bad pain, cramping, or bloating in your belly (abdomen). You have trouble breathing or you are breathing very quickly. Your heart is beating very quickly. Your skin feels cold and clammy. You feel confused. You have signs of losing too much water in your body, such as: Dark pee, very little pee, or no pee. Cracked lips. Dry mouth. Sunken eyes. Sleepiness. Weakness. Summary Diarrhea is when you pass loose and watery poop (stool) often. Diarrhea can make you feel weak and cause you to lose water in your body (get dehydrated). Take an ORS (oral rehydration solution). This is a drink that is sold at pharmacies and stores. Eat bland, easy-to-digest foods in small amounts as you are able. Contact a doctor if your condition gets worse. Get help right away if you have signs that you have lost too much water in your body. This information is not intended to replace advice given to you by your health care provider. Make sure you discuss any questions you have with your health care provider. Document Revised: 08/22/2020 Document Reviewed: 08/22/2020 Elsevier Patient Education  2022 Elsevier Inc. Nausea and Vomiting, Adult Nausea is feeling that you have an upset stomach and that you are about  to vomit. Vomiting is when food in your stomach forcefully comes out of your mouth. Vomiting can make you feel weak. If you vomit, or if you are not able to drink enough fluids, you may not have enough water in your body (get dehydrated). If you do not have enough water in your body, you may: Feel tired. Feel  thirsty. Have a dry mouth. Have cracked lips. Pee (urinate) less often. Older adults and people with other diseases or a weak body defense system (immune system) are at higher risk for not having enough water in the body. If you feel like you may vomit or you vomit, it is important to follow instructions from your doctor about how to take care of yourself. Follow these instructions at home: Watch your symptoms for any changes. Tell your doctor about them. Eating and drinking   Take an ORS (oral rehydration solution). This is a drink that is sold at pharmacies and stores. Drink clear fluids in small amounts as you are able, such as: Water. Ice chips. Fruit juice that has water added (diluted fruit juice). Low-calorie sports drinks. Eat bland, easy-to-digest foods in small amounts as you are able, such as: Bananas. Applesauce. Rice. Low-fat (lean) meats. Toast. Crackers. Avoid drinking fluids that have a lot of sugar or caffeine in them. This includes energy drinks, sports drinks, and soda. Avoid alcohol. Avoid spicy or fatty foods. General instructions Take over-the-counter and prescription medicines only as told by your doctor. Drink enough fluid to keep your pee (urine) pale yellow. Wash your hands often with soap and water for at least 20 seconds. If you cannot use soap and water, use hand sanitizer. Make sure that everyone in your home washes their hands well and often. Rest at home until you feel better. Watch your condition for any changes. Take slow and deep breaths when you feel like you may vomit. Keep all follow-up visits. Contact a doctor if: Your symptoms get worse. You have new symptoms. You have a fever. You cannot drink fluids without vomiting. You feel like you may vomit for more than 2 days. You feel light-headed or dizzy. You have a headache. You have muscle cramps. You have a rash. You have pain while peeing. Get help right away if: You have pain in  your chest, neck, arm, or jaw. You feel very weak or you faint. You vomit again and again. You have vomit that is bright red or looks like black coffee grounds. You have bloody or black poop (stools) or poop that looks like tar. You have a very bad headache, a stiff neck, or both. You have very bad pain, cramping, or bloating in your belly (abdomen). You have trouble breathing. You are breathing very quickly. Your heart is beating very quickly. Your skin feels cold and clammy. You feel confused. You have signs of losing too much water in your body, such as: Dark pee, very little pee, or no pee. Cracked lips. Dry mouth. Sunken eyes. Sleepiness. Weakness. These symptoms may be an emergency. Get help right away. Call 911. Do not wait to see if the symptoms will go away. Do not drive yourself to the hospital. Summary Nausea is feeling that you have an upset stomach and that you are about to vomit. Vomiting is when food in your stomach comes out of your mouth. Follow instructions from your doctor about eating and drinking. Take over-the-counter and prescription medicines only as told by your doctor. Contact your doctor if your symptoms get worse  or you have new symptoms. Keep all follow-up visits. This information is not intended to replace advice given to you by your health care provider. Make sure you discuss any questions you have with your health care provider. Document Revised: 08/17/2020 Document Reviewed: 08/17/2020 Elsevier Patient Education  2022 [employer].

## 2021-04-04 DIAGNOSIS — E559 Vitamin D deficiency, unspecified: Secondary | ICD-10-CM | POA: Diagnosis not present

## 2021-04-04 DIAGNOSIS — K219 Gastro-esophageal reflux disease without esophagitis: Secondary | ICD-10-CM | POA: Diagnosis not present

## 2021-04-04 DIAGNOSIS — R4589 Other symptoms and signs involving emotional state: Secondary | ICD-10-CM | POA: Diagnosis not present

## 2021-04-04 DIAGNOSIS — G4730 Sleep apnea, unspecified: Secondary | ICD-10-CM | POA: Diagnosis not present

## 2021-04-04 DIAGNOSIS — I10 Essential (primary) hypertension: Secondary | ICD-10-CM | POA: Diagnosis not present

## 2021-04-04 DIAGNOSIS — R5383 Other fatigue: Secondary | ICD-10-CM | POA: Diagnosis not present

## 2021-04-04 DIAGNOSIS — E7800 Pure hypercholesterolemia, unspecified: Secondary | ICD-10-CM | POA: Diagnosis not present

## 2021-04-18 DIAGNOSIS — R4589 Other symptoms and signs involving emotional state: Secondary | ICD-10-CM | POA: Diagnosis not present

## 2021-04-18 DIAGNOSIS — K219 Gastro-esophageal reflux disease without esophagitis: Secondary | ICD-10-CM | POA: Diagnosis not present

## 2021-04-18 DIAGNOSIS — G4730 Sleep apnea, unspecified: Secondary | ICD-10-CM | POA: Diagnosis not present

## 2021-04-18 DIAGNOSIS — I10 Essential (primary) hypertension: Secondary | ICD-10-CM | POA: Diagnosis not present

## 2021-05-21 DIAGNOSIS — I10 Essential (primary) hypertension: Secondary | ICD-10-CM | POA: Diagnosis not present

## 2021-05-21 DIAGNOSIS — G4733 Obstructive sleep apnea (adult) (pediatric): Secondary | ICD-10-CM | POA: Diagnosis not present

## 2021-06-21 DIAGNOSIS — G4733 Obstructive sleep apnea (adult) (pediatric): Secondary | ICD-10-CM | POA: Diagnosis not present

## 2021-06-21 DIAGNOSIS — I10 Essential (primary) hypertension: Secondary | ICD-10-CM | POA: Diagnosis not present

## 2021-07-21 DIAGNOSIS — G4733 Obstructive sleep apnea (adult) (pediatric): Secondary | ICD-10-CM | POA: Diagnosis not present

## 2021-07-21 DIAGNOSIS — I10 Essential (primary) hypertension: Secondary | ICD-10-CM | POA: Diagnosis not present

## 2021-08-01 DIAGNOSIS — E119 Type 2 diabetes mellitus without complications: Secondary | ICD-10-CM | POA: Diagnosis not present

## 2021-08-01 DIAGNOSIS — Z6841 Body Mass Index (BMI) 40.0 and over, adult: Secondary | ICD-10-CM | POA: Diagnosis not present

## 2021-08-30 DIAGNOSIS — K219 Gastro-esophageal reflux disease without esophagitis: Secondary | ICD-10-CM | POA: Diagnosis not present

## 2021-08-30 DIAGNOSIS — G4730 Sleep apnea, unspecified: Secondary | ICD-10-CM | POA: Diagnosis not present

## 2021-08-30 DIAGNOSIS — I10 Essential (primary) hypertension: Secondary | ICD-10-CM | POA: Diagnosis not present

## 2021-08-30 DIAGNOSIS — R7303 Prediabetes: Secondary | ICD-10-CM | POA: Diagnosis not present

## 2021-08-30 DIAGNOSIS — E7800 Pure hypercholesterolemia, unspecified: Secondary | ICD-10-CM | POA: Diagnosis not present

## 2021-08-30 DIAGNOSIS — E559 Vitamin D deficiency, unspecified: Secondary | ICD-10-CM | POA: Diagnosis not present

## 2021-08-30 DIAGNOSIS — R590 Localized enlarged lymph nodes: Secondary | ICD-10-CM | POA: Diagnosis not present

## 2021-09-13 DIAGNOSIS — G4730 Sleep apnea, unspecified: Secondary | ICD-10-CM | POA: Diagnosis not present

## 2021-09-13 DIAGNOSIS — R590 Localized enlarged lymph nodes: Secondary | ICD-10-CM | POA: Diagnosis not present

## 2021-09-13 DIAGNOSIS — K219 Gastro-esophageal reflux disease without esophagitis: Secondary | ICD-10-CM | POA: Diagnosis not present

## 2021-09-13 DIAGNOSIS — I10 Essential (primary) hypertension: Secondary | ICD-10-CM | POA: Diagnosis not present

## 2021-12-22 DIAGNOSIS — Z79899 Other long term (current) drug therapy: Secondary | ICD-10-CM | POA: Diagnosis not present

## 2021-12-22 DIAGNOSIS — Z882 Allergy status to sulfonamides status: Secondary | ICD-10-CM | POA: Diagnosis not present

## 2021-12-22 DIAGNOSIS — R1111 Vomiting without nausea: Secondary | ICD-10-CM | POA: Diagnosis not present

## 2021-12-22 DIAGNOSIS — L0889 Other specified local infections of the skin and subcutaneous tissue: Secondary | ICD-10-CM | POA: Diagnosis not present

## 2021-12-22 DIAGNOSIS — R1084 Generalized abdominal pain: Secondary | ICD-10-CM | POA: Diagnosis not present

## 2021-12-22 DIAGNOSIS — N201 Calculus of ureter: Secondary | ICD-10-CM | POA: Diagnosis not present

## 2021-12-22 DIAGNOSIS — N281 Cyst of kidney, acquired: Secondary | ICD-10-CM | POA: Diagnosis not present

## 2021-12-22 DIAGNOSIS — N132 Hydronephrosis with renal and ureteral calculous obstruction: Secondary | ICD-10-CM | POA: Diagnosis not present

## 2021-12-22 DIAGNOSIS — Z883 Allergy status to other anti-infective agents status: Secondary | ICD-10-CM | POA: Diagnosis not present

## 2021-12-22 DIAGNOSIS — Z880 Allergy status to penicillin: Secondary | ICD-10-CM | POA: Diagnosis not present

## 2021-12-22 DIAGNOSIS — Z91030 Bee allergy status: Secondary | ICD-10-CM | POA: Diagnosis not present

## 2021-12-22 DIAGNOSIS — J45909 Unspecified asthma, uncomplicated: Secondary | ICD-10-CM | POA: Diagnosis not present

## 2021-12-22 DIAGNOSIS — Z91013 Allergy to seafood: Secondary | ICD-10-CM | POA: Diagnosis not present

## 2021-12-22 DIAGNOSIS — N200 Calculus of kidney: Secondary | ICD-10-CM | POA: Diagnosis not present

## 2021-12-22 DIAGNOSIS — I10 Essential (primary) hypertension: Secondary | ICD-10-CM | POA: Diagnosis not present

## 2021-12-27 DIAGNOSIS — G4730 Sleep apnea, unspecified: Secondary | ICD-10-CM | POA: Diagnosis not present

## 2021-12-27 DIAGNOSIS — I10 Essential (primary) hypertension: Secondary | ICD-10-CM | POA: Diagnosis not present

## 2021-12-27 DIAGNOSIS — K219 Gastro-esophageal reflux disease without esophagitis: Secondary | ICD-10-CM | POA: Diagnosis not present

## 2021-12-27 DIAGNOSIS — N200 Calculus of kidney: Secondary | ICD-10-CM | POA: Diagnosis not present

## 2022-02-27 DIAGNOSIS — Z1212 Encounter for screening for malignant neoplasm of rectum: Secondary | ICD-10-CM | POA: Diagnosis not present

## 2022-02-27 DIAGNOSIS — Z1211 Encounter for screening for malignant neoplasm of colon: Secondary | ICD-10-CM | POA: Diagnosis not present

## 2022-03-11 LAB — COLOGUARD: COLOGUARD: NEGATIVE

## 2022-03-11 LAB — EXTERNAL GENERIC LAB PROCEDURE: COLOGUARD: NEGATIVE

## 2022-04-02 DIAGNOSIS — R32 Unspecified urinary incontinence: Secondary | ICD-10-CM | POA: Diagnosis not present

## 2022-04-02 DIAGNOSIS — R0602 Shortness of breath: Secondary | ICD-10-CM | POA: Diagnosis not present

## 2022-04-02 DIAGNOSIS — E7800 Pure hypercholesterolemia, unspecified: Secondary | ICD-10-CM | POA: Diagnosis not present

## 2022-04-02 DIAGNOSIS — Z6841 Body Mass Index (BMI) 40.0 and over, adult: Secondary | ICD-10-CM | POA: Diagnosis not present

## 2022-04-02 DIAGNOSIS — E559 Vitamin D deficiency, unspecified: Secondary | ICD-10-CM | POA: Diagnosis not present

## 2022-04-02 DIAGNOSIS — Z20822 Contact with and (suspected) exposure to covid-19: Secondary | ICD-10-CM | POA: Diagnosis not present

## 2022-04-02 DIAGNOSIS — K080 Exfoliation of teeth due to systemic causes: Secondary | ICD-10-CM | POA: Diagnosis not present

## 2022-04-02 DIAGNOSIS — I10 Essential (primary) hypertension: Secondary | ICD-10-CM | POA: Diagnosis not present

## 2022-04-02 DIAGNOSIS — Z0000 Encounter for general adult medical examination without abnormal findings: Secondary | ICD-10-CM | POA: Diagnosis not present

## 2022-04-02 DIAGNOSIS — Z79899 Other long term (current) drug therapy: Secondary | ICD-10-CM | POA: Diagnosis not present

## 2022-04-02 DIAGNOSIS — N200 Calculus of kidney: Secondary | ICD-10-CM | POA: Diagnosis not present

## 2022-04-02 DIAGNOSIS — R5383 Other fatigue: Secondary | ICD-10-CM | POA: Diagnosis not present

## 2022-04-02 DIAGNOSIS — G4730 Sleep apnea, unspecified: Secondary | ICD-10-CM | POA: Diagnosis not present

## 2022-04-02 DIAGNOSIS — K219 Gastro-esophageal reflux disease without esophagitis: Secondary | ICD-10-CM | POA: Diagnosis not present

## 2022-04-02 DIAGNOSIS — R7303 Prediabetes: Secondary | ICD-10-CM | POA: Diagnosis not present

## 2022-04-16 DIAGNOSIS — N200 Calculus of kidney: Secondary | ICD-10-CM | POA: Diagnosis not present

## 2022-04-16 DIAGNOSIS — Z6841 Body Mass Index (BMI) 40.0 and over, adult: Secondary | ICD-10-CM | POA: Diagnosis not present

## 2022-04-16 DIAGNOSIS — I10 Essential (primary) hypertension: Secondary | ICD-10-CM | POA: Diagnosis not present

## 2022-04-16 DIAGNOSIS — K219 Gastro-esophageal reflux disease without esophagitis: Secondary | ICD-10-CM | POA: Diagnosis not present

## 2022-04-16 DIAGNOSIS — G4730 Sleep apnea, unspecified: Secondary | ICD-10-CM | POA: Diagnosis not present

## 2022-07-03 DIAGNOSIS — R32 Unspecified urinary incontinence: Secondary | ICD-10-CM | POA: Diagnosis not present

## 2022-07-03 DIAGNOSIS — R4589 Other symptoms and signs involving emotional state: Secondary | ICD-10-CM | POA: Diagnosis not present

## 2022-07-03 DIAGNOSIS — Z6841 Body Mass Index (BMI) 40.0 and over, adult: Secondary | ICD-10-CM | POA: Diagnosis not present

## 2022-07-03 DIAGNOSIS — I10 Essential (primary) hypertension: Secondary | ICD-10-CM | POA: Diagnosis not present

## 2022-07-03 DIAGNOSIS — Z79899 Other long term (current) drug therapy: Secondary | ICD-10-CM | POA: Diagnosis not present

## 2022-07-03 DIAGNOSIS — K219 Gastro-esophageal reflux disease without esophagitis: Secondary | ICD-10-CM | POA: Diagnosis not present

## 2022-12-22 ENCOUNTER — Telehealth: Payer: Medicare Other

## 2022-12-25 ENCOUNTER — Ambulatory Visit
Admission: EM | Admit: 2022-12-25 | Discharge: 2022-12-25 | Disposition: A | Discharge status: Home / Self Care | Payer: Medicare Other | Attending: Family Medicine | Admitting: Family Medicine

## 2022-12-25 ENCOUNTER — Other Ambulatory Visit: Payer: Self-pay

## 2022-12-25 DIAGNOSIS — R051 Acute cough: Secondary | ICD-10-CM | POA: Diagnosis not present

## 2022-12-25 DIAGNOSIS — Z87442 Personal history of urinary calculi: Secondary | ICD-10-CM | POA: Diagnosis not present

## 2022-12-25 DIAGNOSIS — R1115 Cyclical vomiting syndrome unrelated to migraine: Secondary | ICD-10-CM

## 2022-12-25 LAB — POCT URINALYSIS DIP (MANUAL ENTRY)
Glucose, UA: NEGATIVE mg/dL
Ketones, POC UA: NEGATIVE mg/dL
Leukocytes, UA: NEGATIVE
Nitrite, UA: NEGATIVE
Protein Ur, POC: 100 mg/dL — AB
Spec Grav, UA: 1.025 (ref 1.010–1.025)
Urobilinogen, UA: 0.2 U/dL
pH, UA: 5.5 (ref 5.0–8.0)

## 2022-12-25 MED ORDER — TRAMADOL HCL 50 MG PO TABS: 50.0000 mg | ORAL_TABLET | Freq: Four times a day (QID) | ORAL | 0 refills | Status: DC | PRN

## 2022-12-25 MED ORDER — TAMSULOSIN HCL 0.4 MG PO CAPS: 0.4000 mg | ORAL_CAPSULE | Freq: Every day | ORAL | 1 refills | Status: AC

## 2022-12-25 MED ORDER — CIPROFLOXACIN HCL 500 MG PO TABS: 500.0000 mg | ORAL_TABLET | Freq: Two times a day (BID) | ORAL | 0 refills | Status: DC

## 2022-12-25 MED ORDER — ONDANSETRON HCL 8 MG PO TABS: 8.0000 mg | ORAL_TABLET | Freq: Three times a day (TID) | ORAL | 0 refills | Status: AC | PRN

## 2022-12-25 MED ORDER — POTASSIUM CITRATE ER 10 MEQ (1080 MG) PO TBCR: 10.0000 meq | EXTENDED_RELEASE_TABLET | Freq: Three times a day (TID) | ORAL | 1 refills | Status: DC

## 2022-12-25 NOTE — Discharge Instructions (Addendum)
I have refilled Zofran for vomiting I have filled tramadol for pain I have refilled tamsulosin to take as needed for stones I have refilled a month supply of your your credit You need to at least try a video visit with your primary care doctor Make sure that you drink lots of water I have prescribed Cipro for your infection

## 2022-12-25 NOTE — ED Triage Notes (Signed)
Last Tuesday had c/o nausea and vomiting which lasted a couple of days. Had green diarrhea yesterday. Urine looks like beer and has a lot of cramping.

## 2022-12-25 NOTE — ED Provider Notes (Signed)
[facility]    CSN: [id number] Arrival date & time: 12/25/22  1503      History   Chief Complaint Chief Complaint  Patient presents with   Nausea   Abdominal Cramping   Diarrhea    HPI Brittney Murray is a 62 y.o. female.   Brittney Murray is here for a cough.  Brittney Murray has had a cough and congestion for about a week.  In addition Brittney Murray states Brittney Murray has recently been suffering from kidney stones.  Brittney Murray has had abdominal pain, nausea and vomiting.  Had 1 spell of diarrhea.  No sweats chills or fever.  Brittney Murray states usually with her kidney stones Brittney Murray has tramadol and Zofran to take at home, also tamsulosin.  Brittney Murray is out of all of these medications because Brittney Murray has not seen her PCP since June 2023.  Transportation is very difficult for Brittney Murray.  Brittney Murray has a BMI of over 70, and is minimally ambulatory.  Her husband is blind.  Brittney Murray states that Brittney Murray feels Brittney Murray might be dehydrated because her urine looks dark.  Brittney Murray feels like Brittney Murray needs an antibiotic for her cough.  Brittney Murray does have underlying lung disease and likely hypoventilation from her size.  Her old chart is reviewed.    Past Medical History:  Diagnosis Date   Asthma    GERD (gastroesophageal reflux disease)    Hypertension    Kidney stones    Sleep apnea     Patient Active Problem List   Diagnosis Date Noted   Essential hypertension 02/04/2010   Asthma 02/04/2010    Past Surgical History:  Procedure Laterality Date   GYN surgery     OVARY SURGERY     PARATHYROID EXPLORATION      OB History   No obstetric history on file.      Home Medications    Prior to Admission medications   Medication Sig Start Date End Date Taking? Authorizing Provider  ciprofloxacin (CIPRO) 500 MG tablet Take 1 tablet (500 mg total) by mouth 2 (two) times daily. 12/25/22  Yes Eustace Moore, MD  ondansetron (ZOFRAN) 8 MG tablet Take 1 tablet (8 mg total) by mouth every 8 (eight) hours as needed for nausea or vomiting. 12/25/22  Yes Eustace Moore, MD  tamsulosin (FLOMAX) 0.4 MG CAPS capsule Take 1 capsule (0.4 mg total) by mouth daily after supper. 12/25/22  Yes Eustace Moore, MD  traMADol (ULTRAM) 50 MG tablet Take 1 tablet (50 mg total) by mouth every 6 (six) hours as needed. 12/25/22  Yes Eustace Moore, MD  albuterol (PROVENTIL HFA;VENTOLIN HFA) 108 (90 BASE) MCG/ACT inhaler Inhale 2 puffs into the lungs every 4 (four) hours as needed for wheezing or shortness of breath. 04/24/14   Lattie Haw, MD  albuterol (PROVENTIL HFA;VENTOLIN HFA) 108 (90 Base) MCG/ACT inhaler Inhale 1-2 puffs into the lungs every 6 (six) hours as needed for wheezing or shortness of breath. 02/08/16   Lurene Shadow, PA-C  amLODipine (NORVASC) 10 MG tablet Take 10 mg by mouth daily.    [provider]  ipratropium (ATROVENT) 0.06 % nasal spray Place 2 sprays into both nostrils 4 (four) times daily. 02/13/16   Lurene Shadow, PA-C  lisinopril (PRINIVIL,ZESTRIL) 10 MG tablet Take 20 mg by mouth daily.     [provider]  mometasone (NASONEX) 50 MCG/ACT nasal spray Place 2 sprays into the nose daily.    [provider]  Pantoprazole Sodium (PROTONIX PO)  Take by mouth.    [provider]  potassium citrate (UROCIT-K) 10 MEQ (1080 MG) SR tablet Take 1 tablet (10 mEq total) by mouth 3 (three) times daily with meals. 12/25/22   Eustace Moore, MD    Family History Family History  Problem Relation Age of Onset   Diabetes Mother    Alzheimer's disease Mother    Heart failure Father    COPD Father     Social History Social History   Tobacco Use   Smoking status: Never   Smokeless tobacco: Never  Substance Use Topics   Alcohol use: No   Drug use: No     Allergies   Bee venom, Doxycycline, Penicillins, Shrimp [shellfish allergy], and Sulfonamide derivatives   Review of Systems Review of Systems See HPI  Physical Exam Triage Vital Signs ED Triage Vitals  Encounter Vitals Group     BP 12/25/22  1520 (!) 156/79     Systolic BP Percentile --      Diastolic BP Percentile --      Pulse Rate 12/25/22 1520 (!) 137     Resp 12/25/22 1520 16     Temp 12/25/22 1520 98.7 F (37.1 C)     Temp Source 12/25/22 1520 Oral     SpO2 12/25/22 1520 99 %     Weight --      Height --      Head Circumference --      Peak Flow --      Pain Score 12/25/22 1524 7     Pain Loc --      Pain Education --      Exclude from Growth Chart --    No data found.  Updated Vital Signs BP (!) 156/79   Pulse (!) 137   Temp 98.7 F (37.1 C) (Oral)   Resp 16   SpO2 99%   Physical Exam Constitutional:      General: Brittney Murray is not in acute distress.    Appearance: Brittney Murray is well-developed.     Comments: Superobese.  Dyspneic at rest.  In a wheelchair.  HENT:     Head: Normocephalic and atraumatic.     Mouth/Throat:     Mouth: Mucous membranes are moist.  Eyes:     Conjunctiva/sclera: Conjunctivae normal.     Pupils: Pupils are equal, round, and reactive to light.  Cardiovascular:     Rate and Rhythm: Normal rate and regular rhythm.     Heart sounds: Normal heart sounds.  Pulmonary:     Effort: Pulmonary effort is normal. No respiratory distress.     Breath sounds: Normal breath sounds.     Comments: Slight diminished breath sound.  N no abnormal breath sounds wheezes or rales  Abdominal:     General: There is no distension.     Palpations: Abdomen is soft.  Musculoskeletal:        General: Normal range of motion.     Cervical back: Normal range of motion.  Skin:    General: Skin is warm and dry.  Neurological:     Mental Status: Brittney Murray is alert.      UC Treatments / Results  Labs (all labs ordered are listed, but only abnormal results are displayed) Labs Reviewed  POCT URINALYSIS DIP (MANUAL ENTRY) - Abnormal; Notable for the following components:      Result Value   Color, UA orange (*)    Clarity, UA cloudy (*)    Bilirubin, UA small (*)  Blood, UA trace-intact (*)    Protein Ur, POC  =100 (*)    All other components within normal limits    EKG   Radiology No results found.  Procedures Procedures (including critical care time)  Medications Ordered in UC Medications - No data to display  Initial Impression / Assessment and Plan / UC Course  I have reviewed the triage vital signs and the nursing notes.  Pertinent labs & imaging results that were available during my care of the patient were reviewed by me and considered in my medical decision making (see chart for details).     As patient arrived on my initial triage I discussed with her that this was not the optimal place to see somebody with weakness, possible dehydration, vomiting and diarrhea, and cough.  I thought that Brittney Murray might need an ER visit with lab work.  Brittney Murray explained that Brittney Murray and her husband cannot afford the emergency room, and that transportation is an issue for them.  I agreed to see her and evaluate whether there was an actual emergency with the understanding that Brittney Murray must go to the emergency if Brittney Murray gets worse instead of better.  When discussing antibiotics patient states Brittney Murray has multiple antibiotic allergies and Cipro works best for her.  I cautioned that Cipro has a higher risk of side effects, but Brittney Murray reassures me Brittney Murray has taken it before with success Final Clinical Impressions(s) / UC Diagnoses   Final diagnoses:  History of kidney stones  Emesis, persistent  Acute cough     Discharge Instructions      I have refilled Zofran for vomiting I have filled tramadol for pain I have refilled tamsulosin to take as needed for stones I have refilled a month supply of your your credit You need to at least try a video visit with your primary care doctor Make sure that you drink lots of water I have prescribed Cipro for your infection    ED Prescriptions     Medication Sig Dispense Auth. Provider   potassium citrate (UROCIT-K) 10 MEQ (1080 MG) SR tablet Take 1 tablet (10 mEq total) by mouth 3  (three) times daily with meals. 90 tablet Eustace Moore, MD   traMADol (ULTRAM) 50 MG tablet Take 1 tablet (50 mg total) by mouth every 6 (six) hours as needed. 15 tablet Eustace Moore, MD   tamsulosin (FLOMAX) 0.4 MG CAPS capsule Take 1 capsule (0.4 mg total) by mouth daily after supper. 30 capsule Eustace Moore, MD   ondansetron (ZOFRAN) 8 MG tablet Take 1 tablet (8 mg total) by mouth every 8 (eight) hours as needed for nausea or vomiting. 20 tablet Eustace Moore, MD   ciprofloxacin (CIPRO) 500 MG tablet Take 1 tablet (500 mg total) by mouth 2 (two) times daily. 14 tablet Eustace Moore, MD      I have reviewed the PDMP during this encounter.   Eustace Moore, MD 12/25/22 [phone]

## 2023-01-07 ENCOUNTER — Encounter: Payer: Self-pay | Admitting: Family Medicine

## 2023-01-07 ENCOUNTER — Ambulatory Visit: Payer: Medicare Other | Admitting: Family Medicine

## 2023-01-07 DIAGNOSIS — N200 Calculus of kidney: Secondary | ICD-10-CM | POA: Insufficient documentation

## 2023-01-07 DIAGNOSIS — T884XXA Failed or difficult intubation, initial encounter: Secondary | ICD-10-CM | POA: Insufficient documentation

## 2023-01-07 DIAGNOSIS — F32A Depression, unspecified: Secondary | ICD-10-CM | POA: Insufficient documentation

## 2023-01-08 ENCOUNTER — Encounter: Payer: Self-pay | Admitting: Family Medicine

## 2023-01-08 ENCOUNTER — Ambulatory Visit (INDEPENDENT_AMBULATORY_CARE_PROVIDER_SITE_OTHER): Payer: Medicare Other | Admitting: Family Medicine

## 2023-01-08 VITALS — BP 138/85 | HR 101 | Temp 97.4°F | Resp 20 | Ht 60.0 in | Wt >= 6400 oz

## 2023-01-08 DIAGNOSIS — I10 Essential (primary) hypertension: Secondary | ICD-10-CM | POA: Diagnosis not present

## 2023-01-08 DIAGNOSIS — G4733 Obstructive sleep apnea (adult) (pediatric): Secondary | ICD-10-CM

## 2023-01-08 DIAGNOSIS — Z7689 Persons encountering health services in other specified circumstances: Secondary | ICD-10-CM

## 2023-01-08 DIAGNOSIS — N200 Calculus of kidney: Secondary | ICD-10-CM | POA: Diagnosis not present

## 2023-01-08 DIAGNOSIS — R7302 Impaired glucose tolerance (oral): Secondary | ICD-10-CM

## 2023-01-08 DIAGNOSIS — K219 Gastro-esophageal reflux disease without esophagitis: Secondary | ICD-10-CM

## 2023-01-08 MED ORDER — BACLOFEN 5 MG PO TABS: 5.0000 mg | ORAL_TABLET | Freq: Three times a day (TID) | ORAL | 0 refills | Status: DC | PRN

## 2023-01-08 NOTE — Progress Notes (Signed)
New Patient Office Visit  Subjective    Patient ID: Brittney Murray, female    DOB: 03/18/1960  Age: 62 y.o. MRN: [id number]  CC:  Chief Complaint  Patient presents with   Establish Care    Follow up from Urgent care patient states that she had kidney stones in October she did pass them but still having issues with her lower back . Patient states that she had spotting  last night  pink in color    HPI Brittney Murray presents to establish care. Pt is new to me. Switched from [facility] center and no longer wants to go there.   Pt is here with concerns with kidney stones. She was on Urocit-K for her kidney stones but her previous PCP stopped this. She then developed pain and had to go to urgent care in October for flank pain along with vomiting/diarrhea. She was given antibiotic for UTI along with Ultram and Flomax. She was also given Urocit refills. She hasn't seen Urologist in quite some time. She is still having some spotting. She stopped the Flomax due to hx of urinary incontinence. She is taking vesicare 10mg  daily for incontinence. She still has some back spasms that has calmed down but still present in the right side. CT scan reviewed from October 2023 and made note left kidney stones with hydronephrosis. She would like muscle relaxer to help with the muscle spasms.  Pt has hx of HTN and is taking Amlodipine 10mg  and Lisinopril 40mg  daily. She has hx of GERD and taking Protonix 40mg  daily. She reports hx of prediabetes. Hasn't had A1c checked in a long time. Never been diagnosed with diabetes and unsure how this is on her chart. Not taking any medicines for diabetes. She has OSA and wearing CPAP at 11 cm H20. Outpatient Encounter Medications as of 01/08/2023  Medication Sig   albuterol (PROVENTIL HFA;VENTOLIN HFA) 108 (90 BASE) MCG/ACT inhaler Inhale 2 puffs into the lungs every 4 (four) hours as needed for wheezing or shortness of breath.   amLODipine (NORVASC) 10 MG tablet Take 10  mg by mouth daily.   Baclofen 5 MG TABS Take 1 tablet (5 mg total) by mouth 3 (three) times daily as needed.   Blood Glucose Monitoring Suppl (ONETOUCH VERIO) w/Device KIT by Does not apply route.   glucose blood (ONETOUCH VERIO) test strip Use to test blood sugar once daily.   LIFESCAN FINEPOINT LANCETS MISC Use to check blood sugar 1 time(s) daily   lisinopril (ZESTRIL) 40 MG tablet Take 40 mg by mouth daily.   ondansetron (ZOFRAN) 8 MG tablet Take 1 tablet (8 mg total) by mouth every 8 (eight) hours as needed for nausea or vomiting.   Pantoprazole Sodium (PROTONIX PO) Take by mouth.   potassium citrate (UROCIT-K) 10 MEQ (1080 MG) SR tablet Take 1 tablet (10 mEq total) by mouth 3 (three) times daily with meals.   sertraline (ZOLOFT) 100 MG tablet Take 100 mg by mouth daily.   solifenacin (VESICARE) 10 MG tablet Take 10 mg by mouth daily.   tamsulosin (FLOMAX) 0.4 MG CAPS capsule Take 1 capsule (0.4 mg total) by mouth daily after supper.   traMADol (ULTRAM) 50 MG tablet Take by mouth every 6 (six) hours as needed.   [DISCONTINUED] albuterol (PROVENTIL HFA;VENTOLIN HFA) 108 (90 Base) MCG/ACT inhaler Inhale 1-2 puffs into the lungs every 6 (six) hours as needed for wheezing or shortness of breath.   No facility-administered encounter medications on file as of 01/08/2023.  Past Medical History:  Diagnosis Date   Allergy    Anxiety    Arthritis    Asthma    Depression    GERD (gastroesophageal reflux disease)    Hypertension    Kidney stones    Sleep apnea     Past Surgical History:  Procedure Laterality Date   GYN surgery     OVARY SURGERY     PARATHYROID EXPLORATION      Family History  Problem Relation Age of Onset   Diabetes Mother    Alzheimer's disease Mother    Depression Mother    Heart failure Father    COPD Father    Obesity Father    Drug abuse Brother    Early death Brother     Social History   Socioeconomic History   Marital status: Married    Spouse  name: Not on file   Number of children: Not on file   Years of education: Not on file   Highest education level: Associate degree: occupational, Scientist, product/process development, or vocational program  Occupational History   Not on file  Tobacco Use   Smoking status: Never    Passive exposure: Never   Smokeless tobacco: Never  Vaping Use   Vaping status: Never Used  Substance and Sexual Activity   Alcohol use: No   Drug use: No   Sexual activity: Not Currently    Birth control/protection: Other-see comments    Comment: To old Husband cant get it up  Other Topics Concern   Not on file  Social History Narrative   Not on file   Social Determinants of Health   Financial Resource Strain: Medium Risk (01/07/2023)   Overall Financial Resource Strain (CARDIA)    Difficulty of Paying Living Expenses: Somewhat hard  Food Insecurity: Food Insecurity Present (01/07/2023)   Hunger Vital Sign    Worried About Running Out of Food in the Last Year: Sometimes true    Ran Out of Food in the Last Year: Sometimes true  Transportation Needs: Unmet Transportation Needs (01/07/2023)   PRAPARE - Administrator, Civil Service (Medical): Yes    Lack of Transportation (Non-Medical): No  Physical Activity: Insufficiently Active (01/07/2023)   Exercise Vital Sign    Days of Exercise per Week: 1 day    Minutes of Exercise per Session: 20 min  Stress: Stress Concern Present (01/07/2023)   [employer] of Occupational Health - Occupational Stress Questionnaire    Feeling of Stress : Very much  Social Connections: Moderately Integrated (01/07/2023)   Social Connection and Isolation Panel [NHANES]    Frequency of Communication with Friends and Family: Twice a week    Frequency of Social Gatherings with Friends and Family: Once a week    Attends Religious Services: More than 4 times per year    Active Member of [employer] or Organizations: No    Attends Engineer, structural: Not on file    Marital  Status: Married  Intimate Partner Violence: Unknown (05/27/2021)   Received from [facility]   HITS    Physically Hurt: Not on file    Insult or Talk Down To: Not on file    Threaten Physical Harm: Not on file    Scream or Curse: Not on file    Review of Systems  Genitourinary:  Positive for flank pain and hematuria.  All other systems reviewed and are negative.      Objective    BP 138/85   Pulse Marland Kitchen)  101   Temp (!) 97.4 F (36.3 C) (Oral)   Resp 20   Ht 5' (1.524 m)   Wt (!) 420 lb (190.5 kg)   SpO2 98%   BMI 82.03 kg/m   Physical Exam Vitals and nursing note reviewed.  Constitutional:      Appearance: Normal appearance. She is obese.  HENT:     Head: Normocephalic and atraumatic.     Right Ear: External ear normal.     Left Ear: External ear normal.     Nose: Nose normal.     Mouth/Throat:     Mouth: Mucous membranes are moist.     Pharynx: Oropharynx is clear.  Eyes:     Conjunctiva/sclera: Conjunctivae normal.     Pupils: Pupils are equal, round, and reactive to light.  Cardiovascular:     Rate and Rhythm: Regular rhythm. Tachycardia present.     Pulses: Normal pulses.     Heart sounds: Normal heart sounds.  Pulmonary:     Effort: Pulmonary effort is normal.     Breath sounds: Normal breath sounds.  Abdominal:     General: Abdomen is flat. Bowel sounds are normal.  Skin:    General: Skin is warm.     Capillary Refill: Capillary refill takes less than 2 seconds.  Neurological:     General: No focal deficit present.     Mental Status: She is alert and oriented to person, place, and time. Mental status is at baseline.     Comments: In wheelchair  Psychiatric:        Mood and Affect: Mood normal.        Behavior: Behavior normal.        Thought Content: Thought content normal.        Judgment: Judgment normal.      Assessment & Plan:   Problem List Items Addressed This Visit       Cardiovascular and Mediastinum   Essential hypertension      Respiratory   OSA on CPAP     Digestive   GERD (gastroesophageal reflux disease)     Genitourinary   Calculus of kidney   Relevant Medications   traMADol (ULTRAM) 50 MG tablet   Baclofen 5 MG TABS   Other Relevant Orders   Urinalysis   Ambulatory referral to Urology   Other Visit Diagnoses     Encounter to establish care with new doctor    -  Primary   Impaired glucose tolerance       Relevant Orders   Comprehensive metabolic panel   Hemoglobin A1c      Encounter to establish care with new doctor  Impaired glucose tolerance -     Comprehensive metabolic panel -     Hemoglobin A1c  OSA on CPAP  Essential hypertension  Gastroesophageal reflux disease without esophagitis  Calculus of kidney -     Urinalysis -     Ambulatory referral to Urology -     Baclofen; Take 1 tablet (5 mg total) by mouth 3 (three) times daily as needed.  Dispense: 30 tablet; Refill: 0  Due to hx of prediabetes, recheck A1c and CMP. Pt was advised to continue taking Urocit for kidney stones. She reports improving pain. Will refer to Urology for further evaluation and treatment options. Will do U/A today along with baclofen 5mg  TID prn for muscle spasms in her back. HTN and GERD stable. Continue regimen for now.  OSA stable and to continue wearing CPAP. No  follow-ups on file.   Suzan Slick, MD

## 2023-01-09 ENCOUNTER — Encounter: Payer: Self-pay | Admitting: Family Medicine

## 2023-01-09 LAB — URINALYSIS
Bilirubin, UA: NEGATIVE
Glucose, UA: NEGATIVE
Ketones, UA: NEGATIVE
Nitrite, UA: NEGATIVE
Specific Gravity, UA: 1.018 (ref 1.005–1.030)
Urobilinogen, Ur: 0.2 mg/dL (ref 0.2–1.0)
pH, UA: 6 (ref 5.0–7.5)

## 2023-01-09 LAB — COMPREHENSIVE METABOLIC PANEL
ALT: 15 [IU]/L (ref 0–32)
AST: 16 [IU]/L (ref 0–40)
Albumin: 3.8 g/dL — ABNORMAL LOW (ref 3.9–4.9)
Alkaline Phosphatase: 94 [IU]/L (ref 44–121)
BUN/Creatinine Ratio: 15 (ref 12–28)
BUN: 18 mg/dL (ref 8–27)
Bilirubin Total: 0.3 mg/dL (ref 0.0–1.2)
CO2: 19 mmol/L — ABNORMAL LOW (ref 20–29)
Calcium: 9.6 mg/dL (ref 8.7–10.3)
Chloride: 102 mmol/L (ref 96–106)
Creatinine, Ser: 1.21 mg/dL — ABNORMAL HIGH (ref 0.57–1.00)
Globulin, Total: 3.5 g/dL (ref 1.5–4.5)
Glucose: 136 mg/dL — ABNORMAL HIGH (ref 70–99)
Potassium: 5 mmol/L (ref 3.5–5.2)
Sodium: 140 mmol/L (ref 134–144)
Total Protein: 7.3 g/dL (ref 6.0–8.5)
eGFR: 51 mL/min/{1.73_m2} — ABNORMAL LOW (ref >59)

## 2023-01-09 LAB — HEMOGLOBIN A1C
Est. average glucose Bld gHb Est-mCnc: 163 mg/dL
Hgb A1c MFr Bld: 7.3 % — ABNORMAL HIGH (ref 4.8–5.6)

## 2023-01-11 ENCOUNTER — Encounter: Payer: Self-pay | Admitting: Family Medicine

## 2023-01-11 DIAGNOSIS — Z6841 Body Mass Index (BMI) 40.0 and over, adult: Secondary | ICD-10-CM

## 2023-01-11 DIAGNOSIS — E1165 Type 2 diabetes mellitus with hyperglycemia: Secondary | ICD-10-CM

## 2023-01-12 ENCOUNTER — Telehealth: Payer: Self-pay | Admitting: Family Medicine

## 2023-01-12 ENCOUNTER — Other Ambulatory Visit: Payer: Self-pay | Admitting: Family Medicine

## 2023-01-12 MED ORDER — TRULICITY 0.75 MG/0.5ML ~~LOC~~ SOAJ: 0.7500 mg | SUBCUTANEOUS | 0 refills | Status: DC

## 2023-01-12 MED ORDER — CIPROFLOXACIN HCL 500 MG PO TABS: 500.0000 mg | ORAL_TABLET | Freq: Two times a day (BID) | ORAL | 0 refills | Status: AC

## 2023-01-12 NOTE — Telephone Encounter (Signed)
I completed forms to be faxed for Trulicity. The patient would like the forms mailed to her once faxed; as it's difficult for her to leave the home.

## 2023-01-12 NOTE — Telephone Encounter (Signed)
Forms faxed per provider indication on paperwork. Will mail with copy of confirmation when confirmation is received. Katha Hamming

## 2023-01-13 DIAGNOSIS — G4733 Obstructive sleep apnea (adult) (pediatric): Secondary | ICD-10-CM | POA: Diagnosis not present

## 2023-01-27 ENCOUNTER — Encounter: Payer: Medicare Other | Admitting: Urology

## 2023-02-11 ENCOUNTER — Other Ambulatory Visit: Payer: Self-pay | Admitting: Family Medicine

## 2023-02-11 DIAGNOSIS — N200 Calculus of kidney: Secondary | ICD-10-CM

## 2023-02-23 ENCOUNTER — Other Ambulatory Visit: Payer: Self-pay | Admitting: Family Medicine

## 2023-02-23 ENCOUNTER — Telehealth: Payer: Self-pay

## 2023-02-23 MED ORDER — AMLODIPINE BESYLATE 10 MG PO TABS: 10.0000 mg | ORAL_TABLET | Freq: Every day | ORAL | 1 refills | Status: DC

## 2023-02-23 NOTE — Telephone Encounter (Signed)
Not [facility]K patient.

## 2023-02-23 NOTE — Telephone Encounter (Signed)
Copied from CRM [id number]. Topic: Clinical - Medication Refill >> Feb 23, 2023  2:27 PM Elle L wrote: Most Recent Primary Care Visit:  Provider: Suzan Slick  Department[facility]  Visit Type: NEW PATIENT  Date: 01/08/2023  Medication: amLODipine (NORVASC) 10 MG tablet traMADol (ULTRAM) 50 MG tablet  Has the patient contacted their pharmacy? Yes   Is this the correct pharmacy for this prescription? Yes  This is the patient's preferred pharmacy:  [facility] [address], [place] - [address] [place] [address] Phone: [phone] Fax: [phone]   Has the prescription been filled recently? Yes  Is the patient out of the medication? Yes  Has the patient been seen for an appointment in the last year OR does the patient have an upcoming appointment? Yes  Can we respond through MyChart? Yes  Agent: Please be advised that Rx refills may take up to 3 business days. We ask that you follow-up with your pharmacy.

## 2023-02-23 NOTE — Telephone Encounter (Signed)
Refilled Norvasc only. The ultram was just for kidney stone acute pain so didn't refill this one.

## 2023-02-26 ENCOUNTER — Encounter: Payer: Medicare Other | Admitting: Urology

## 2023-03-02 DIAGNOSIS — G4733 Obstructive sleep apnea (adult) (pediatric): Secondary | ICD-10-CM | POA: Diagnosis not present

## 2023-03-09 ENCOUNTER — Other Ambulatory Visit: Payer: Self-pay | Admitting: Family Medicine

## 2023-03-09 ENCOUNTER — Encounter: Payer: Self-pay | Admitting: Family Medicine

## 2023-03-09 MED ORDER — ONETOUCH VERIO VI STRP: ORAL_STRIP | 3 refills | Status: AC

## 2023-03-09 MED ORDER — SERTRALINE HCL 100 MG PO TABS: 100.0000 mg | ORAL_TABLET | Freq: Every day | ORAL | 0 refills | Status: DC

## 2023-04-03 ENCOUNTER — Other Ambulatory Visit: Payer: Self-pay | Admitting: Family Medicine

## 2023-04-03 NOTE — Telephone Encounter (Signed)
 Copied from CRM [id number]. Topic: Clinical - Medication Refill >> Apr 03, 2023  1:21 PM Elle L wrote: Most Recent Primary Care Visit:  Provider: COLETTE TORRENCE GRADE  Department: [facility]  Visit Type: NEW PATIENT  Date: 01/08/2023  Medication: potassium citrate  (UROCIT-K ) 10 MEQ (1080 MG) SR tablet  Has the patient contacted their pharmacy? No  Is this the correct pharmacy for this prescription? Yes  This is the patient's preferred pharmacy:  [facility] [address], [place] - [address] [place] [address] Phone: [phone] Fax: [phone]   Has the prescription been filled recently? No  Is the patient out of the medication? Yes  Has the patient been seen for an appointment in the last year OR does the patient have an upcoming appointment? Yes  Can we respond through MyChart? Yes  Agent: Please be advised that Rx refills may take up to 3 business days. We ask that you follow-up with your pharmacy.

## 2023-04-06 ENCOUNTER — Telehealth: Payer: Self-pay | Admitting: Family Medicine

## 2023-04-06 ENCOUNTER — Encounter: Payer: Self-pay | Admitting: Family Medicine

## 2023-04-06 MED ORDER — POTASSIUM CITRATE ER 10 MEQ (1080 MG) PO TBCR: 10.0000 meq | EXTENDED_RELEASE_TABLET | Freq: Three times a day (TID) | ORAL | 1 refills | Status: DC

## 2023-04-06 MED ORDER — SERTRALINE HCL 100 MG PO TABS: 100.0000 mg | ORAL_TABLET | Freq: Every day | ORAL | 0 refills | Status: DC

## 2023-04-06 NOTE — Addendum Note (Signed)
 Addended by: Manette Section on: 04/06/2023 03:11 PM   Modules accepted: Orders

## 2023-04-06 NOTE — Telephone Encounter (Signed)
 Copied from CRM [id number]. Topic: Clinical - Prescription Issue >> Apr 06, 2023  2:39 PM Tisa Forester wrote: Reason for CRM: potassium citrate  (UROCIT-K ) 10 MEQ (1080 MG) SR tablet been out of medication for 5 days to help with her kidney stone . traMADol  (ULTRAM ) 50 MG tablet, sertraline  (ZOLOFT ) 100 MG tablet Patient received a message in mychart that her medication refills were deny and want to know why ? [facility] - [place], [place] - [address] [place] [address] Phone: [phone] Fax: [phone] Hours: Not open 24 hours

## 2023-04-09 ENCOUNTER — Encounter: Payer: Self-pay | Admitting: Family Medicine

## 2023-04-09 ENCOUNTER — Ambulatory Visit (INDEPENDENT_AMBULATORY_CARE_PROVIDER_SITE_OTHER): Payer: Medicare Other | Admitting: Family Medicine

## 2023-04-09 VITALS — BP 136/74 | HR 104 | Resp 16 | Ht 60.0 in | Wt >= 6400 oz

## 2023-04-09 DIAGNOSIS — G8929 Other chronic pain: Secondary | ICD-10-CM | POA: Diagnosis not present

## 2023-04-09 DIAGNOSIS — N3941 Urge incontinence: Secondary | ICD-10-CM

## 2023-04-09 DIAGNOSIS — Z5181 Encounter for therapeutic drug level monitoring: Secondary | ICD-10-CM

## 2023-04-09 DIAGNOSIS — M25561 Pain in right knee: Secondary | ICD-10-CM

## 2023-04-09 DIAGNOSIS — M25562 Pain in left knee: Secondary | ICD-10-CM

## 2023-04-09 MED ORDER — SOLIFENACIN SUCCINATE 10 MG PO TABS: 10.0000 mg | ORAL_TABLET | Freq: Every day | ORAL | 1 refills | Status: DC

## 2023-04-09 MED ORDER — TRAMADOL HCL 50 MG PO TABS: 50.0000 mg | ORAL_TABLET | Freq: Two times a day (BID) | ORAL | 0 refills | Status: DC | PRN

## 2023-04-09 NOTE — Progress Notes (Signed)
 Established Patient Office Visit  Subjective   Patient ID: Brittney Murray, female    DOB: Jun 28, 1960  Age: 63 y.o. MRN: [id number]  Chief Complaint  Patient presents with   Follow-up    Pain in knees    HPI  Pt is using Vesicare 10mg  daily for urinary incontinence. She is needing this refilled. This is controlling her incontinence.  Pt has chronic pain in her knees. She uses Ultram 50mg   1 tab po BID prn. She needs this refilled today. She isn't using every day and may use 1-2 times a day. She declines vaccines.   Review of Systems  Musculoskeletal:  Positive for joint pain.       Chronic bilateral  knee pain  All other systems reviewed and are negative.    Objective:     Pulse (!) 104   Resp 16   Ht 5' (1.524 m)   Wt (!) 416 lb 6.4 oz (188.9 kg)   SpO2 99%   BMI 81.32 kg/m  BP Readings from Last 3 Encounters:  04/09/23 136/74  01/08/23 138/85  12/25/22 (!) 156/79      Physical Exam Vitals and nursing note reviewed.  Constitutional:      Appearance: Normal appearance. She is obese.  HENT:     Head: Normocephalic and atraumatic.     Right Ear: External ear normal.     Left Ear: External ear normal.     Nose: Nose normal.     Mouth/Throat:     Mouth: Mucous membranes are moist.     Pharynx: Oropharynx is clear.  Eyes:     Conjunctiva/sclera: Conjunctivae normal.     Pupils: Pupils are equal, round, and reactive to light.  Cardiovascular:     Rate and Rhythm: Normal rate and regular rhythm.     Pulses: Normal pulses.     Heart sounds: Normal heart sounds.  Pulmonary:     Effort: Pulmonary effort is normal.     Breath sounds: Normal breath sounds.  Skin:    General: Skin is warm.     Capillary Refill: Capillary refill takes less than 2 seconds.  Neurological:     General: No focal deficit present.     Mental Status: She is alert and oriented to person, place, and time. Mental status is at baseline.     Gait: Gait abnormal.     Comments: Uses  wheelchair  Psychiatric:        Mood and Affect: Mood normal.        Behavior: Behavior normal.        Thought Content: Thought content normal.        Judgment: Judgment normal.    No results found for any visits on 04/09/23.     The ASCVD Risk score (Arnett DK, et al., 2019) failed to calculate for the following reasons:   Cannot find a previous HDL lab   Cannot find a previous total cholesterol lab    Assessment & Plan:   Problem List Items Addressed This Visit   None  Urge incontinence of urine -     Solifenacin Succinate; Take 1 tablet (10 mg total) by mouth daily.  Dispense: 90 tablet; Refill: 1  Chronic pain of both knees -     traMADol HCl; Take 1 tablet (50 mg total) by mouth every 12 (twelve) hours as needed.  Dispense: 60 tablet; Refill: 0  Medication monitoring encounter -     ToxASSURE Select 13 (MW), Urine  Refilled Vesicare 10mg  daily for urinary incontinence. Chronic and stable. For chronic knee pain, CSA and UDS done today for Tramadol/Ultram 50mg  BID prn. Refilled this today. To see in 3 months for CPE and 6 weeks for telephone AWV No follow-ups on file.    Suzan Slick, MD

## 2023-04-11 LAB — TOXASSURE SELECT 13 (MW), URINE

## 2023-05-21 ENCOUNTER — Ambulatory Visit: Payer: Medicare Other | Admitting: Family Medicine

## 2023-05-21 ENCOUNTER — Encounter: Payer: Self-pay | Admitting: Family Medicine

## 2023-05-21 DIAGNOSIS — Z0000 Encounter for general adult medical examination without abnormal findings: Secondary | ICD-10-CM

## 2023-05-21 DIAGNOSIS — Z139 Encounter for screening, unspecified: Secondary | ICD-10-CM

## 2023-05-21 DIAGNOSIS — Z1231 Encounter for screening mammogram for malignant neoplasm of breast: Secondary | ICD-10-CM

## 2023-05-21 NOTE — Progress Notes (Signed)
 Subjective:   Brittney Murray is a 63 y.o. female who presents for Medicare Annual (Subsequent) preventive examination.  Visit Complete: Virtual I connected with  Laure Kidney on 05/21/23 by a audio enabled telemedicine application and verified that I am speaking with the correct person using two identifiers.  Patient Location: Home  Provider Location: Home Office  I discussed the limitations of evaluation and management by telemedicine. The patient expressed understanding and agreed to proceed.  Vital Signs: Because this visit was a virtual/telehealth visit, some criteria may be missing or patient reported. Any vitals not documented were not able to be obtained and vitals that have been documented are patient reported.  Patient Medicare AWV questionnaire was completed by the patient on 05/21/23; I have confirmed that all information answered by patient is correct and no changes since this date.  Cardiac Risk Factors include: diabetes mellitus;hypertension;obesity (BMI >30kg/m2);sedentary lifestyle     Objective:    Today's Vitals   05/21/23 1314  PainSc: 7    There is no height or weight on file to calculate BMI.     01/08/2023    2:25 PM  Advanced Directives  Does Patient Have a Medical Advance Directive? Yes  Type of Advance Directive Out of facility DNR (pink MOST or yellow form);Living will;Healthcare Power of Attorney    Current Medications (verified) Outpatient Encounter Medications as of 05/21/2023  Medication Sig   albuterol (PROVENTIL HFA;VENTOLIN HFA) 108 (90 BASE) MCG/ACT inhaler Inhale 2 puffs into the lungs every 4 (four) hours as needed for wheezing or shortness of breath.   amLODipine (NORVASC) 10 MG tablet Take 1 tablet (10 mg total) by mouth daily.   Baclofen 5 MG TABS Take 1 tablet by mouth three times daily as needed   Blood Glucose Monitoring Suppl (ONETOUCH VERIO) w/Device KIT by Does not apply route.   glucose blood (ONETOUCH VERIO) test strip Use as  instructed   LIFESCAN FINEPOINT LANCETS MISC Use to check blood sugar 1 time(s) daily   lisinopril (ZESTRIL) 40 MG tablet Take 40 mg by mouth daily.   ondansetron (ZOFRAN) 8 MG tablet Take 1 tablet (8 mg total) by mouth every 8 (eight) hours as needed for nausea or vomiting.   Pantoprazole Sodium (PROTONIX PO) Take by mouth.   potassium citrate (UROCIT-K) 10 MEQ (1080 MG) SR tablet Take 1 tablet (10 mEq total) by mouth 3 (three) times daily with meals.   sertraline (ZOLOFT) 100 MG tablet Take 1 tablet (100 mg total) by mouth daily.   solifenacin (VESICARE) 10 MG tablet Take 1 tablet (10 mg total) by mouth daily.   tamsulosin (FLOMAX) 0.4 MG CAPS capsule Take 1 capsule (0.4 mg total) by mouth daily after supper.   traMADol (ULTRAM) 50 MG tablet Take 1 tablet (50 mg total) by mouth every 12 (twelve) hours as needed.   [DISCONTINUED] Dulaglutide (TRULICITY) 0.75 MG/0.5ML SOAJ Inject 0.75 mg into the skin once a week.   No facility-administered encounter medications on file as of 05/21/2023.    Allergies (verified) Bee venom, Doxycycline, Penicillins, Shrimp [shellfish allergy], Sulfonamide derivatives, and Torsemide   History: Past Medical History:  Diagnosis Date   Allergy    Anxiety    Arthritis    Asthma    Depression    GERD (gastroesophageal reflux disease)    Hypertension    Kidney stones    Sleep apnea    Past Surgical History:  Procedure Laterality Date   GYN surgery     OVARY SURGERY  PARATHYROID EXPLORATION     Family History  Problem Relation Age of Onset   Diabetes Mother    Alzheimer's disease Mother    Depression Mother    Heart failure Father    COPD Father    Obesity Father    Drug abuse Brother    Early death Brother    Social History   Socioeconomic History   Marital status: Married    Spouse name: Not on file   Number of children: Not on file   Years of education: Not on file   Highest education level: Associate degree: occupational, Scientist, product/process development,  or vocational program  Occupational History   Not on file  Tobacco Use   Smoking status: Never    Passive exposure: Never   Smokeless tobacco: Never  Vaping Use   Vaping status: Never Used  Substance and Sexual Activity   Alcohol use: No   Drug use: No   Sexual activity: Not Currently    Birth control/protection: Other-see comments    Comment: To old Husband cant get it up  Other Topics Concern   Not on file  Social History Narrative   Not on file   Social Drivers of Health   Financial Resource Strain: High Risk (04/02/2023)   Overall Financial Resource Strain (CARDIA)    Difficulty of Paying Living Expenses: Hard  Food Insecurity: Food Insecurity Present (05/21/2023)   Hunger Vital Sign    Worried About Running Out of Food in the Last Year: Sometimes true    Ran Out of Food in the Last Year: Sometimes true  Transportation Needs: No Transportation Needs (04/02/2023)   PRAPARE - Administrator, Civil Service (Medical): No    Lack of Transportation (Non-Medical): No  Recent Concern: Transportation Needs - Unmet Transportation Needs (01/07/2023)   PRAPARE - Administrator, Civil Service (Medical): Yes    Lack of Transportation (Non-Medical): No  Physical Activity: Inactive (04/02/2023)   Exercise Vital Sign    Days of Exercise per Week: 0 days    Minutes of Exercise per Session: 20 min  Stress: Stress Concern Present (04/02/2023)   [employer] of Occupational Health - Occupational Stress Questionnaire    Feeling of Stress : Very much  Social Connections: Moderately Integrated (04/02/2023)   Social Connection and Isolation Panel [NHANES]    Frequency of Communication with Friends and Family: Once a week    Frequency of Social Gatherings with Friends and Family: Never    Attends Religious Services: More than 4 times per year    Active Member of [employer] or Organizations: Yes    Attends Engineer, structural: More than 4 times per year    Marital  Status: Married    Tobacco Counseling Counseling given: Not Answered   Clinical Intake:  Pre-visit preparation completed: No  Pain : 0-10 Pain Score: 7  Pain Type: Chronic pain Pain Location: Knee Pain Orientation: Right Pain Descriptors / Indicators: Radiating Pain Onset: More than a month ago Pain Frequency: Several days a week Pain Relieving Factors: Salon pas, ice, Motrin, Ultram prn has pain contract Effect of Pain on Daily Activities: unable to walk  Pain Relieving Factors: Salon pas, ice, Motrin, Ultram prn has pain contract  BMI - recorded: 81 Nutritional Status: BMI > 30  Obese Nutritional Risks: None Diabetes: Yes CBG done?: No Did pt. bring in CBG monitor from home?: No  How often do you need to have someone help you when you read instructions,  pamphlets, or other written materials from your doctor or pharmacy?: 1 - Never What is the last grade level you completed in school?: Associates degree  Interpreter Needed?: No      Activities of Daily Living    05/21/2023    1:19 PM  In your present state of health, do you have any difficulty performing the following activities:  Hearing? 0  Vision? 0  Difficulty concentrating or making decisions? 0  Walking or climbing stairs? 1  Dressing or bathing? 0  Doing errands, shopping? 0  Preparing Food and eating ? N  Using the Toilet? N  In the past six months, have you accidently leaked urine? Y  Do you have problems with loss of bowel control? N  Managing your Medications? N  Managing your Finances? N  Housekeeping or managing your Housekeeping? N    Patient Care Team: Suzan Slick, MD as PCP - General (Family Medicine)  Indicate any recent Medical Services you may have received from other than [facility] providers in the past year (date may be approximate).     Assessment:   This is a routine wellness examination for Brittney Murray.  Hearing/Vision screen No results found.   Goals Addressed              This Visit's Progress    Activity and Exercise Increased       Evidence-based guidance:  Review current exercise levels.  Assess patient perspective on exercise or activity level, barriers to increasing activity, motivation and readiness for change.  Recommend or set healthy exercise goal based on individual tolerance.  Encourage small steps toward making change in amount of exercise or activity.  Urge reduction of sedentary activities or screen time.  Promote group activities within the community or with family or support person.  Consider referral to rehabiliation therapist for assessment and exercise/activity plan.   Notes:  Also trying to go back to work part time      Depression Screen    05/21/2023    1:25 PM 05/21/2023    1:24 PM 01/08/2023    2:24 PM  PHQ 2/9 Scores  PHQ - 2 Score 1 1 1   PHQ- 9 Score 5  12    Fall Risk    05/21/2023    1:27 PM 05/21/2023    1:25 PM 01/08/2023    2:23 PM  Fall Risk   Falls in the past year? 0 0 1  Number falls in past yr: 0 0 1  Injury with Fall? 0 0 1  Risk for fall due to : History of fall(s);Impaired balance/gait;Impaired mobility History of fall(s);Impaired mobility;Impaired balance/gait History of fall(s)  Follow up Falls evaluation completed;Education provided;Falls prevention discussed Falls evaluation completed;Education provided;Falls prevention discussed Falls evaluation completed    MEDICARE RISK AT HOME: Medicare Risk at Home Any stairs in or around the home?: No If so, are there any without handrails?: No Home free of loose throw rugs in walkways, pet beds, electrical cords, etc?: Yes Adequate lighting in your home to reduce risk of falls?: Yes Life alert?: No Use of a cane, walker or w/c?: Yes (cane and wheelchair) Grab bars in the bathroom?: Yes Shower chair or bench in shower?: Yes Elevated toilet seat or a handicapped toilet?: No  TIMED UP AND GO:  Was the test performed?  No    Cognitive Function:         05/21/2023    1:28 PM  6CIT Screen  What Year? 0 points  What  month? 0 points  What time? 0 points  Count back from 20 0 points  Months in reverse 0 points  Repeat phrase 0 points  Total Score 0 points    Immunizations  There is no immunization history on file for this patient.  TDAP status: Due, Education has been provided regarding the importance of this vaccine. Advised may receive this vaccine at local pharmacy or Health Dept. Aware to provide a copy of the vaccination record if obtained from local pharmacy or Health Dept. Verbalized acceptance and understanding.  Flu Vaccine status: Declined, Education has been provided regarding the importance of this vaccine but patient still declined. Advised may receive this vaccine at local pharmacy or Health Dept. Aware to provide a copy of the vaccination record if obtained from local pharmacy or Health Dept. Verbalized acceptance and understanding.  Pneumococcal vaccine status: Declined,  Education has been provided regarding the importance of this vaccine but patient still declined. Advised may receive this vaccine at local pharmacy or Health Dept. Aware to provide a copy of the vaccination record if obtained from local pharmacy or Health Dept. Verbalized acceptance and understanding.   Covid-19 vaccine status: Declined, Education has been provided regarding the importance of this vaccine but patient still declined. Advised may receive this vaccine at local pharmacy or Health Dept.or vaccine clinic. Aware to provide a copy of the vaccination record if obtained from local pharmacy or Health Dept. Verbalized acceptance and understanding.  Qualifies for Shingles Vaccine? Yes   Zostavax completed No   Shingrix Completed?: No.    Education has been provided regarding the importance of this vaccine. Patient has been advised to call insurance company to determine out of pocket expense if they have not yet received this vaccine. Advised may also  receive vaccine at local pharmacy or Health Dept. Verbalized acceptance and understanding.  Screening Tests Health Maintenance  Topic Date Due   COVID-19 Vaccine (1) Never done   FOOT EXAM  Never done   OPHTHALMOLOGY EXAM  Never done   HIV Screening  Never done   Diabetic kidney evaluation - Urine ACR  Never done   Hepatitis C Screening  Never done   DTaP/Tdap/Td (1 - Tdap) Never done   Zoster Vaccines- Shingrix (1 of 2) Never done   Cervical Cancer Screening (HPV/Pap Cotest)  Never done   MAMMOGRAM  Never done   INFLUENZA VACCINE  05/25/2023 (Originally 09/25/2022)   Pneumococcal Vaccine 47-13 Years old (1 of 2 - PCV) 04/08/2024 (Originally 09/21/1966)   HEMOGLOBIN A1C  07/08/2023   Diabetic kidney evaluation - eGFR measurement  01/08/2024   Medicare Annual Wellness (AWV)  05/20/2024   Fecal DNA (Cologuard)  02/27/2025   HPV VACCINES  Aged Out    Health Maintenance  Health Maintenance Due  Topic Date Due   COVID-19 Vaccine (1) Never done   FOOT EXAM  Never done   OPHTHALMOLOGY EXAM  Never done   HIV Screening  Never done   Diabetic kidney evaluation - Urine ACR  Never done   Hepatitis C Screening  Never done   DTaP/Tdap/Td (1 - Tdap) Never done   Zoster Vaccines- Shingrix (1 of 2) Never done   Cervical Cancer Screening (HPV/Pap Cotest)  Never done   MAMMOGRAM  Never done    Colorectal cancer screening: Type of screening: Cologuard. Completed Jan 2024. Repeat every 3 years  Mammogram status: Ordered today. Pt provided with contact info and advised to call to schedule appt.   Bone Density Not  indicated  Lung Cancer Screening: (Low Dose CT Chest recommended if Age 69-80 years, 20 pack-year currently smoking OR have quit w/in 15years.) does not qualify.   Lung Cancer Screening Referral: N/A  Additional Screening:  Hepatitis C Screening: does not qualify; Completed not done  Vision Screening: Recommended annual ophthalmology exams for early detection of glaucoma and  other disorders of the eye. Is the patient up to date with their annual eye exam?  Yes  Who is the provider or what is the name of the office in which the patient attends annual eye exams? [facility] [address] If pt is not established with a provider, would they like to be referred to a provider to establish care? No .   Dental Screening: Recommended annual dental exams for proper oral hygiene  Diabetic Foot Exam: Diabetic Foot Exam: Overdue, Pt has been advised about the importance in completing this exam. Pt is scheduled for diabetic foot exam on May 2025.  Community Resource Referral / Chronic Care Management: CRR required this visit?  Yes   CCM required this visit?  Appt scheduled with PCP     Plan:     I have personally reviewed and noted the following in the patient's chart:   Medical and social history Use of alcohol, tobacco or illicit drugs  Current medications and supplements including opioid prescriptions. Patient is currently taking opioid prescriptions. Information provided to patient regarding non-opioid alternatives. Patient advised to discuss non-opioid treatment plan with their provider. Functional ability and status Nutritional status Physical activity Advanced directives List of other physicians Hospitalizations, surgeries, and ER visits in previous 12 months Vitals Screenings to include cognitive, depression, and falls Referrals and appointments  In addition, I have reviewed and discussed with patient certain preventive protocols, quality metrics, and best practice recommendations. A written personalized care plan for preventive services as well as general preventive health recommendations were provided to patient.     Suzan Slick, MD   05/21/2023   After Visit Summary: (MyChart) Due to this being a telephonic visit, the after visit summary with patients personalized plan was offered to patient via MyChart   Nurse Notes: None

## 2023-05-21 NOTE — Patient Instructions (Signed)
  Ms. Fromer , Thank you for taking time to come for your Medicare Wellness Visit. I appreciate your ongoing commitment to your health goals. Please review the following plan we discussed and let me know if I can assist you in the future.   These are the goals we discussed:  Goals      Activity and Exercise Increased     Evidence-based guidance:  Review current exercise levels.  Assess patient perspective on exercise or activity level, barriers to increasing activity, motivation and readiness for change.  Recommend or set healthy exercise goal based on individual tolerance.  Encourage small steps toward making change in amount of exercise or activity.  Urge reduction of sedentary activities or screen time.  Promote group activities within the community or with family or support person.  Consider referral to rehabiliation therapist for assessment and exercise/activity plan.   Notes:  Also trying to go back to work part time        This is a list of the screening recommended for you and due dates:  Health Maintenance  Topic Date Due   COVID-19 Vaccine (1) Never done   Complete foot exam   Never done   Eye exam for diabetics  Never done   HIV Screening  Never done   Yearly kidney health urinalysis for diabetes  Never done   Hepatitis C Screening  Never done   DTaP/Tdap/Td vaccine (1 - Tdap) Never done   Zoster (Shingles) Vaccine (1 of 2) Never done   Mammogram  Never done   Flu Shot  05/25/2023*   Pneumococcal Vaccination (1 of 2 - PCV) 04/08/2024*   Pap with HPV screening  05/20/2024*   Hemoglobin A1C  07/08/2023   Yearly kidney function blood test for diabetes  01/08/2024   Medicare Annual Wellness Visit  05/20/2024   Cologuard (Stool DNA test)  02/27/2025   HPV Vaccine  Aged Out  *Topic was postponed. The date shown is not the original due date.

## 2023-05-26 ENCOUNTER — Telehealth: Payer: Self-pay

## 2023-05-26 DIAGNOSIS — E119 Type 2 diabetes mellitus without complications: Secondary | ICD-10-CM | POA: Diagnosis not present

## 2023-05-28 NOTE — Progress Notes (Signed)
 Complex Care Management Note  Care Guide Note 05/28/2023 Name: Vona Whiters MRN: [id number] DOB: [birth year]  Aubriel Khanna is a 63 y.o. year old female who sees Rucker, Magdalen Spatz, MD for primary care. I reached out to Laure Kidney by phone today to offer complex care management services.  Ms. Kimble was given information about Complex Care Management services today including:   The Complex Care Management services include support from the care team which includes your Nurse Care Manager, Clinical Social Worker, or Pharmacist.  The Complex Care Management team is here to help remove barriers to the health concerns and goals most important to you. Complex Care Management services are voluntary, and the patient may decline or stop services at any time by request to their care team member.   Complex Care Management Consent Status: Patient agreed to services and verbal consent obtained.   Follow up plan:  Telephone appointment with complex care management team member scheduled for:  06/02/23 at 2:00 p.m.   Encounter Outcome:  Patient Scheduled  [facility]  [facility], [facility] Health Care Management Assistant Direct Dial: [phone]  Fax: [phone]

## 2023-06-01 DIAGNOSIS — G4733 Obstructive sleep apnea (adult) (pediatric): Secondary | ICD-10-CM | POA: Diagnosis not present

## 2023-06-02 ENCOUNTER — Ambulatory Visit: Payer: Self-pay | Admitting: Licensed Clinical Social Worker

## 2023-06-02 NOTE — Patient Outreach (Signed)
 Complex Care Management   Visit Note  06/02/2023  Name:  Brittney Murray MRN: [id number] DOB: 01-19-1961  Situation: Referral received for Complex Care Management related to SDOH Barriers:  Food insecurity And HHA with [employer] and [employer]BS  I obtained verbal consent from the patient .  Visit completed with the patient  on the phone  Background:   Past Medical History:  Diagnosis Date   Allergy    Anxiety    Arthritis    Asthma    Depression    GERD (gastroesophageal reflux disease)    Hypertension    Kidney stones    Sleep apnea     Assessment: Patient Reported Symptoms:  Cognitive    Neurological      HEENT      Cardiovascular      Respiratory      Endocrine      Gastrointestinal      Genitourinary      Integumentary      Musculoskeletal      Psychosocial       There were no vitals filed for this visit.  Medications Reviewed Today   Medications were not reviewed in this encounter     Recommendation:   To follow up with BCBS and Care link for the [facility] and SW emailed the EPAss link for Snap benefits application and emailed the food pantry list  Follow Up Plan:   Telephone follow-up 06/04/2023 at 10:45 am  Jeanie Cooks, PhD [facility], [facility] Social Worker Direct Dial: [phone]  Fax: [phone]

## 2023-06-02 NOTE — Patient Instructions (Signed)
 Visit Information  Thank you for taking time to visit with me today. Please don't hesitate to contact me if I can be of assistance to you before our next scheduled appointment.  Our next appointment is by telephone on 06/04/2023 at 10:45 am Please call the care guide team at [phone] if you need to cancel or reschedule your appointment.   Following is a copy of your care plan:   Goals Addressed             This Visit's Progress    VBCI Social Work Care Plan       Problems:   Financial constraints related to Being on a fixed income  and food insecurities  BSW Clinical Goal(s):   Over the next 2 days the Patient will will follow up with [employer] in regards to Care link and HHA as directed by Social Work.  Interventions:  Social Determinants of Health in Patient with  Food insecurities : SDOH assessments completed: Food Insecurity  and BCBS and Carelink HHA Evaluation of current treatment plan related to unmet needs Food resources: assistance with [employer] application and emailed food pantry information   Patient Goals/Self-Care Activities:  Coordinate with [employer]BS to assist with HHA and Carelink. Follow up on insurance options BCBS and Care link.   Plan: SW will follow up on 06/04/2023 at 10:45 am          Please call the Suicide and Crisis Lifeline: 988 go to [facility] Urgent [facility] [address], [place] [phone]) call 911 if you are experiencing a Mental Health or Behavioral Health Crisis or need someone to talk to.  Patient verbalizes understanding of instructions and care plan provided today and agrees to view in MyChart. Active MyChart status and patient understanding of how to access instructions and care plan via MyChart confirmed with patient.     Jeanie Cooks, PhD [facility], [facility] Social Worker Direct Dial: [phone]  Fax: [phone]

## 2023-06-03 ENCOUNTER — Telehealth: Payer: Self-pay | Admitting: Licensed Clinical Social Worker

## 2023-06-03 NOTE — Patient Outreach (Signed)
 Patient called SW to state that she needs to reschedule appointment for tomorrow, patient was driving and could not look to see the next available date, SW or CG will call to reschedule.

## 2023-06-04 ENCOUNTER — Other Ambulatory Visit: Payer: Self-pay | Admitting: Licensed Clinical Social Worker

## 2023-06-17 ENCOUNTER — Other Ambulatory Visit: Payer: Self-pay | Admitting: Licensed Clinical Social Worker

## 2023-06-25 ENCOUNTER — Ambulatory Visit

## 2023-06-25 DIAGNOSIS — Z1231 Encounter for screening mammogram for malignant neoplasm of breast: Secondary | ICD-10-CM

## 2023-06-25 DIAGNOSIS — E119 Type 2 diabetes mellitus without complications: Secondary | ICD-10-CM | POA: Diagnosis not present

## 2023-07-01 ENCOUNTER — Encounter: Payer: Self-pay | Admitting: Family Medicine

## 2023-07-04 ENCOUNTER — Other Ambulatory Visit: Payer: Self-pay | Admitting: Family Medicine

## 2023-07-07 ENCOUNTER — Encounter: Payer: Self-pay | Admitting: Family Medicine

## 2023-07-07 ENCOUNTER — Ambulatory Visit: Payer: Medicare Other | Admitting: Family Medicine

## 2023-07-07 VITALS — BP 142/76 | HR 113 | Temp 98.2°F | Resp 18 | Ht 60.0 in | Wt >= 6400 oz

## 2023-07-07 DIAGNOSIS — Z13220 Encounter for screening for lipoid disorders: Secondary | ICD-10-CM

## 2023-07-07 DIAGNOSIS — Z23 Encounter for immunization: Secondary | ICD-10-CM

## 2023-07-07 DIAGNOSIS — E119 Type 2 diabetes mellitus without complications: Secondary | ICD-10-CM

## 2023-07-07 DIAGNOSIS — Z136 Encounter for screening for cardiovascular disorders: Secondary | ICD-10-CM

## 2023-07-07 DIAGNOSIS — Z1329 Encounter for screening for other suspected endocrine disorder: Secondary | ICD-10-CM | POA: Diagnosis not present

## 2023-07-07 DIAGNOSIS — Z0000 Encounter for general adult medical examination without abnormal findings: Secondary | ICD-10-CM

## 2023-07-07 LAB — HM DIABETES FOOT EXAM: HM Diabetic Foot Exam: NORMAL

## 2023-07-07 NOTE — Progress Notes (Signed)
 Complete physical exam  Patient: Brittney Murray   DOB: 1961/02/12   63 y.o. Female  MRN: [id number]  Subjective:     Chief Complaint  Patient presents with   Annual Exam    Patient is here for annual physical. Patient is fasting    Brittney Murray is a 63 y.o. female who presents today for a complete physical exam. She reports consuming a general diet. The patient does not participate in regular exercise at present. She generally feels well. She reports sleeping fairly well. She does not have additional problems to discuss today.  Pt in agreement for PCV 20 today due to hx of diabetes. Was only on Trulicity but stopped due to GI issues. Willing to try different medicine pending A1c results.  Most recent fall risk assessment:    05/21/2023    1:27 PM  Fall Risk   Falls in the past year? 0  Number falls in past yr: 0  Injury with Fall? 0  Risk for fall due to : History of fall(s);Impaired balance/gait;Impaired mobility  Follow up Falls evaluation completed;Education provided;Falls prevention discussed     Most recent depression screenings:    05/21/2023    1:25 PM 05/21/2023    1:24 PM  PHQ 2/9 Scores  PHQ - 2 Score 1 1  PHQ- 9 Score 5     Vision:Within last year and [facility] [address]  Patient Active Problem List   Diagnosis Date Noted   Calculus of kidney 01/07/2023   Depression 01/07/2023   Hard to intubate 01/07/2023   Type 2 diabetes mellitus (HCC) 04/20/2019   Arthritis 11/29/2013   Chronic low back pain 11/29/2013   GERD (gastroesophageal reflux disease) 11/29/2013   Morbid obesity (HCC) 11/29/2013   S/P right oophorectomy 11/29/2013   OSA on CPAP 11/29/2013   Essential hypertension 02/04/2010   Asthma 02/04/2010   Past Medical History:  Diagnosis Date   Allergy    Anxiety    Arthritis    Asthma    Depression    GERD (gastroesophageal reflux disease)    Hypertension    Kidney stones    Sleep apnea    Past Surgical History:  Procedure Laterality  Date   GYN surgery     OVARY SURGERY     PARATHYROID  EXPLORATION     Social History   Socioeconomic History   Marital status: Married    Spouse name: Not on file   Number of children: Not on file   Years of education: Not on file   Highest education level: Associate degree: occupational, Scientist, product/process development, or vocational program  Occupational History   Not on file  Tobacco Use   Smoking status: Never    Passive exposure: Never   Smokeless tobacco: Never  Vaping Use   Vaping status: Never Used  Substance and Sexual Activity   Alcohol  use: No   Drug use: No   Sexual activity: Not Currently    Birth control/protection: Other-see comments    Comment: To old Husband cant get it up  Other Topics Concern   Not on file  Social History Narrative   Not on file   Social Drivers of Health   Financial Resource Strain: High Risk (04/02/2023)   Overall Financial Resource Strain (CARDIA)    Difficulty of Paying Living Expenses: Hard  Food Insecurity: Food Insecurity Present (06/02/2023)   Hunger Vital Sign    Worried About Running Out of Food in the Last Year: Sometimes true  Ran Out of Food in the Last Year: Sometimes true  Transportation Needs: No Transportation Needs (06/02/2023)   PRAPARE - Administrator, Civil Service (Medical): No    Lack of Transportation (Non-Medical): No  Physical Activity: Inactive (04/02/2023)   Exercise Vital Sign    Days of Exercise per Week: 0 days    Minutes of Exercise per Session: 20 min  Stress: Stress Concern Present (04/02/2023)   [employer] of Occupational Health - Occupational Stress Questionnaire    Feeling of Stress : Very much  Social Connections: Moderately Integrated (04/02/2023)   Social Connection and Isolation Panel [NHANES]    Frequency of Communication with Friends and Family: Once a week    Frequency of Social Gatherings with Friends and Family: Never    Attends Religious Services: More than 4 times per year    Active Member  of [employer] or Organizations: Yes    Attends Engineer, structural: More than 4 times per year    Marital Status: Married  Catering manager Violence: Not At Risk (06/02/2023)   Humiliation, Afraid, Rape, and Kick questionnaire    Fear of Current or Ex-Partner: No    Emotionally Abused: No    Physically Abused: No    Sexually Abused: No   Family History  Problem Relation Age of Onset   Diabetes Mother    Alzheimer's disease Mother    Depression Mother    Heart failure Father    COPD Father    Obesity Father    Drug abuse Brother    Early death Brother    Allergies  Allergen Reactions   Bee Venom    Doxycycline    Penicillins     Has taken cephalexin  without problems   Shrimp [Shellfish Allergy]    Sulfonamide Derivatives    Torsemide Other (See Comments)    Inability to walk and muscle weakness      Patient Care Team: Manette Section, MD as PCP - General (Family Medicine) Ronney Cola as Social Worker   Outpatient Medications Prior to Visit  Medication Sig   albuterol  (PROVENTIL  HFA;VENTOLIN  HFA) 108 (90 BASE) MCG/ACT inhaler Inhale 2 puffs into the lungs every 4 (four) hours as needed for wheezing or shortness of breath.   amLODipine  (NORVASC ) 10 MG tablet Take 1 tablet (10 mg total) by mouth daily.   Baclofen  5 MG TABS Take 1 tablet by mouth three times daily as needed   Blood Glucose Monitoring Suppl (ONETOUCH VERIO) w/Device KIT by Does not apply route.   glucose blood (ONETOUCH VERIO) test strip Use as instructed   LIFESCAN FINEPOINT LANCETS MISC Use to check blood sugar 1 time(s) daily   lisinopril (ZESTRIL) 40 MG tablet Take 40 mg by mouth daily.   ondansetron  (ZOFRAN ) 8 MG tablet Take 1 tablet (8 mg total) by mouth every 8 (eight) hours as needed for nausea or vomiting.   Pantoprazole Sodium (PROTONIX PO) Take by mouth.   potassium citrate  (UROCIT-K ) 10 MEQ (1080 MG) SR tablet TAKE 1 TABLET BY MOUTH THREE TIMES DAILY WITH MEALS   sertraline  (ZOLOFT )  100 MG tablet Take 1 tablet (100 mg total) by mouth daily.   solifenacin  (VESICARE ) 10 MG tablet Take 1 tablet (10 mg total) by mouth daily.   tamsulosin  (FLOMAX ) 0.4 MG CAPS capsule Take 1 capsule (0.4 mg total) by mouth daily after supper.   traMADol  (ULTRAM ) 50 MG tablet Take 1 tablet (50 mg total) by mouth every 12 (twelve) hours  as needed.   No facility-administered medications prior to visit.    Review of Systems  All other systems reviewed and are negative.        Objective:     BP (!) 142/76   Pulse (!) 113   Temp 98.2 F (36.8 C) (Oral)   Resp 18   Ht 5' (1.524 m)   Wt (!) 419 lb 9.6 oz (190.3 kg)   SpO2 94%   BMI 81.95 kg/m  BP Readings from Last 3 Encounters:  07/07/23 (!) 142/76  04/09/23 136/74  01/08/23 138/85      Physical Exam Vitals and nursing note reviewed.  Constitutional:      Appearance: Normal appearance. She is obese.  HENT:     Head: Normocephalic and atraumatic.     Right Ear: External ear normal.     Left Ear: External ear normal.     Nose: Nose normal.     Mouth/Throat:     Mouth: Mucous membranes are moist.     Pharynx: Oropharynx is clear.  Eyes:     Conjunctiva/sclera: Conjunctivae normal.     Pupils: Pupils are equal, round, and reactive to light.  Cardiovascular:     Rate and Rhythm: Normal rate and regular rhythm.     Pulses: Normal pulses.     Heart sounds: Normal heart sounds.  Pulmonary:     Effort: Pulmonary effort is normal.     Breath sounds: Normal breath sounds.  Skin:    General: Skin is warm.     Capillary Refill: Capillary refill takes less than 2 seconds.  Neurological:     General: No focal deficit present.     Mental Status: She is alert and oriented to person, place, and time. Mental status is at baseline.     Gait: Gait abnormal.     Comments: In wheelchair  Psychiatric:        Mood and Affect: Mood normal.        Behavior: Behavior normal.        Thought Content: Thought content normal.         Judgment: Judgment normal.     No results found for any visits on 07/07/23. Last CBC Lab Results  Component Value Date   WBC 8.3 09/10/2015   HGB 13.4 09/10/2015   HCT 39.8 09/10/2015   MCV 82.6 09/10/2015   MCH 27.8 09/10/2015   RDW 14.9 09/10/2015   PLT 316 09/10/2015   Last metabolic panel Lab Results  Component Value Date   GLUCOSE 136 (H) 01/08/2023   NA 140 01/08/2023   K 5.0 01/08/2023   CL 102 01/08/2023   CO2 19 (L) 01/08/2023   BUN 18 01/08/2023   CREATININE 1.21 (H) 01/08/2023   EGFR 51 (L) 01/08/2023   CALCIUM 9.6 01/08/2023   PROT 7.3 01/08/2023   ALBUMIN 3.8 (L) 01/08/2023   LABGLOB 3.5 01/08/2023   BILITOT 0.3 01/08/2023   ALKPHOS 94 01/08/2023   AST 16 01/08/2023   ALT 15 01/08/2023   Last lipids No results found for: "CHOL", "HDL", "LDLCALC", "LDLDIRECT", "TRIG", "CHOLHDL" Last hemoglobin A1c Lab Results  Component Value Date   HGBA1C 7.3 (H) 01/08/2023   Last thyroid  functions No results found for: "TSH", "T3TOTAL", "T4TOTAL", "THYROIDAB"      Assessment & Plan:    Routine Health Maintenance and Physical Exam   There is no immunization history on file for this patient.  Health Maintenance  Topic Date Due   FOOT EXAM  Never done   OPHTHALMOLOGY EXAM  Never done   HIV Screening  Never done   Diabetic kidney evaluation - Urine ACR  Never done   Hepatitis C Screening  Never done   Pneumococcal Vaccine 35-83 Years old (1 of 2 - PCV) 04/08/2024 (Originally 09/20/1980)   Cervical Cancer Screening (HPV/Pap Cotest)  05/20/2024 (Originally 09/21/1991)   HEMOGLOBIN A1C  07/08/2023   INFLUENZA VACCINE  09/25/2023   Diabetic kidney evaluation - eGFR measurement  01/08/2024   Medicare Annual Wellness (AWV)  05/20/2024   Fecal DNA (Cologuard)  02/27/2025   MAMMOGRAM  06/24/2025   HPV VACCINES  Aged Out   Meningococcal B Vaccine  Aged Out   DTaP/Tdap/Td  Discontinued   COVID-19 Vaccine  Discontinued   Zoster Vaccines- Shingrix  Discontinued     Discussed health benefits of physical activity, and encouraged her to engage in regular exercise appropriate for her age and condition.  Problem List Items Addressed This Visit   None  No follow-ups on file. Annual physical exam  Encounter for lipid screening for cardiovascular disease -     Lipid panel  Type 2 diabetes mellitus without complication, without long-term current use of insulin (HCC) -     CBC with Differential/Platelet -     Comprehensive metabolic panel with GFR -     Hemoglobin A1c -     Microalbumin / creatinine urine ratio  Need for vaccination against Streptococcus pneumoniae -     Pneumococcal conjugate vaccine 20-valent  Screening for thyroid  disorder -     TSH -     T4, free   Screening labs PCV-20 today See in 4 months sooner prn.    Manette Section, MD

## 2023-07-08 ENCOUNTER — Other Ambulatory Visit: Payer: Self-pay | Admitting: Family Medicine

## 2023-07-08 ENCOUNTER — Ambulatory Visit: Payer: Self-pay | Admitting: Family Medicine

## 2023-07-08 DIAGNOSIS — E782 Mixed hyperlipidemia: Secondary | ICD-10-CM

## 2023-07-08 DIAGNOSIS — E119 Type 2 diabetes mellitus without complications: Secondary | ICD-10-CM

## 2023-07-08 DIAGNOSIS — G4733 Obstructive sleep apnea (adult) (pediatric): Secondary | ICD-10-CM

## 2023-07-08 LAB — COMPREHENSIVE METABOLIC PANEL WITH GFR
ALT: 16 IU/L (ref 0–32)
AST: 16 IU/L (ref 0–40)
Albumin: 4.1 g/dL (ref 3.9–4.9)
Alkaline Phosphatase: 95 IU/L (ref 44–121)
BUN/Creatinine Ratio: 20 (ref 12–28)
BUN: 20 mg/dL (ref 8–27)
Bilirubin Total: 0.4 mg/dL (ref 0.0–1.2)
CO2: 22 mmol/L (ref 20–29)
Calcium: 9.4 mg/dL (ref 8.7–10.3)
Chloride: 103 mmol/L (ref 96–106)
Creatinine, Ser: 1.01 mg/dL — ABNORMAL HIGH (ref 0.57–1.00)
Globulin, Total: 2.7 g/dL (ref 1.5–4.5)
Glucose: 117 mg/dL — ABNORMAL HIGH (ref 70–99)
Potassium: 5 mmol/L (ref 3.5–5.2)
Sodium: 141 mmol/L (ref 134–144)
Total Protein: 6.8 g/dL (ref 6.0–8.5)
eGFR: 63 mL/min/{1.73_m2} (ref >59)

## 2023-07-08 LAB — CBC WITH DIFFERENTIAL/PLATELET
Basophils Absolute: 0 10*3/uL (ref 0.0–0.2)
Basos: 1 %
EOS (ABSOLUTE): 0.2 10*3/uL (ref 0.0–0.4)
Eos: 2 %
Hematocrit: 41 % (ref 34.0–46.6)
Hemoglobin: 13 g/dL (ref 11.1–15.9)
Immature Grans (Abs): 0.1 10*3/uL (ref 0.0–0.1)
Immature Granulocytes: 1 %
Lymphocytes Absolute: 1.1 10*3/uL (ref 0.7–3.1)
Lymphs: 14 %
MCH: 27 pg (ref 26.6–33.0)
MCHC: 31.7 g/dL (ref 31.5–35.7)
MCV: 85 fL (ref 79–97)
Monocytes Absolute: 0.5 10*3/uL (ref 0.1–0.9)
Monocytes: 7 %
Neutrophils Absolute: 5.7 10*3/uL (ref 1.4–7.0)
Neutrophils: 75 %
Platelets: 297 10*3/uL (ref 150–450)
RBC: 4.81 x10E6/uL (ref 3.77–5.28)
RDW: 14.3 % (ref 11.7–15.4)
WBC: 7.6 10*3/uL (ref 3.4–10.8)

## 2023-07-08 LAB — T4, FREE: Free T4: 1.08 ng/dL (ref 0.82–1.77)

## 2023-07-08 LAB — MICROALBUMIN / CREATININE URINE RATIO
Creatinine, Urine: 108.4 mg/dL
Microalb/Creat Ratio: 4 mg/g{creat} (ref 0–29)
Microalbumin, Urine: 4.1 ug/mL

## 2023-07-08 LAB — LIPID PANEL
Chol/HDL Ratio: 4.4 ratio (ref 0.0–4.4)
Cholesterol, Total: 249 mg/dL — ABNORMAL HIGH (ref 100–199)
HDL: 56 mg/dL (ref >39)
LDL Chol Calc (NIH): 173 mg/dL — ABNORMAL HIGH (ref 0–99)
Triglycerides: 115 mg/dL (ref 0–149)
VLDL Cholesterol Cal: 20 mg/dL (ref 5–40)

## 2023-07-08 LAB — HEMOGLOBIN A1C
Est. average glucose Bld gHb Est-mCnc: 137 mg/dL
Hgb A1c MFr Bld: 6.4 % — ABNORMAL HIGH (ref 4.8–5.6)

## 2023-07-08 LAB — TSH: TSH: 2.56 u[IU]/mL (ref 0.450–4.500)

## 2023-07-08 MED ORDER — TIRZEPATIDE-WEIGHT MANAGEMENT 2.5 MG/0.5ML ~~LOC~~ SOLN: 2.5000 mg | SUBCUTANEOUS | 3 refills | Status: AC

## 2023-07-08 MED ORDER — ATORVASTATIN CALCIUM 20 MG PO TABS: 20.0000 mg | ORAL_TABLET | Freq: Every evening | ORAL | 1 refills | Status: AC

## 2023-07-08 NOTE — Addendum Note (Signed)
 Addended by: Manette Section on: 07/08/2023 08:23 AM   Modules accepted: Orders

## 2023-07-08 NOTE — Addendum Note (Signed)
 Addended by: Manette Section on: 07/08/2023 01:07 PM   Modules accepted: Level of Service

## 2023-07-09 NOTE — Telephone Encounter (Signed)
 Prior auth for: [employer] Determination: PENDING Auth #: [id number] Valid from: N/A Patient notified via MyChart

## 2023-07-10 NOTE — Telephone Encounter (Signed)
 Prior auth for: [facility] Determination: APPROVED Auth #: [id number] Valid from: 07/09/23 - 07/08/24 Patient notified via MyChart

## 2023-07-16 ENCOUNTER — Encounter: Payer: Self-pay | Admitting: Family Medicine

## 2023-07-22 ENCOUNTER — Other Ambulatory Visit: Payer: Self-pay | Admitting: Family Medicine

## 2023-07-22 DIAGNOSIS — G8929 Other chronic pain: Secondary | ICD-10-CM

## 2023-07-26 DIAGNOSIS — E119 Type 2 diabetes mellitus without complications: Secondary | ICD-10-CM | POA: Diagnosis not present

## 2023-08-13 ENCOUNTER — Other Ambulatory Visit: Payer: Self-pay | Admitting: Family Medicine

## 2023-08-18 ENCOUNTER — Other Ambulatory Visit: Payer: Self-pay | Admitting: Family Medicine

## 2023-08-24 ENCOUNTER — Other Ambulatory Visit: Payer: Self-pay

## 2023-08-24 ENCOUNTER — Encounter: Payer: Self-pay | Admitting: Family Medicine

## 2023-08-24 DIAGNOSIS — Z23 Encounter for immunization: Secondary | ICD-10-CM

## 2023-08-24 DIAGNOSIS — G4733 Obstructive sleep apnea (adult) (pediatric): Secondary | ICD-10-CM

## 2023-08-24 MED ORDER — ALBUTEROL SULFATE HFA 108 (90 BASE) MCG/ACT IN AERS: 2.0000 | INHALATION_SPRAY | RESPIRATORY_TRACT | 0 refills | Status: DC | PRN

## 2023-08-24 MED ORDER — ALBUTEROL SULFATE HFA 108 (90 BASE) MCG/ACT IN AERS: 2.0000 | INHALATION_SPRAY | RESPIRATORY_TRACT | 4 refills | Status: AC | PRN

## 2023-08-25 DIAGNOSIS — E119 Type 2 diabetes mellitus without complications: Secondary | ICD-10-CM | POA: Diagnosis not present

## 2023-09-15 ENCOUNTER — Other Ambulatory Visit: Payer: Self-pay | Admitting: Family Medicine

## 2023-09-21 NOTE — Telephone Encounter (Unsigned)
 Copied from CRM [id number]. Topic: Clinical - Medication Refill >> Sep 21, 2023  2:08 PM Jasmin G wrote: Medication: lisinopril (ZESTRIL) 40 MG tablet  Has the patient contacted their pharmacy? Yes (Agent: If no, request that the patient contact the pharmacy for the refill. If patient does not wish to contact the pharmacy document the reason why and proceed with request.) (Agent: If yes, when and what did the pharmacy advise?)  This is the patient's preferred pharmacy:  [facility] [address], [place] - [address] [place] [address] Phone: [phone] Fax: [phone]  Is this the correct pharmacy for this prescription? Yes If no, delete pharmacy and type the correct one.   Has the prescription been filled recently? No  Is the patient out of the medication? No  Has the patient been seen for an appointment in the last year OR does the patient have an upcoming appointment? Yes  Can we respond through MyChart? No  Agent: Please be advised that Rx refills may take up to 3 business days. We ask that you follow-up with your pharmacy.

## 2023-09-25 DIAGNOSIS — E119 Type 2 diabetes mellitus without complications: Secondary | ICD-10-CM | POA: Diagnosis not present

## 2023-10-17 ENCOUNTER — Other Ambulatory Visit: Payer: Self-pay | Admitting: Family Medicine

## 2023-10-17 DIAGNOSIS — N3941 Urge incontinence: Secondary | ICD-10-CM

## 2023-10-26 DIAGNOSIS — E119 Type 2 diabetes mellitus without complications: Secondary | ICD-10-CM | POA: Diagnosis not present

## 2023-11-10 DIAGNOSIS — E119 Type 2 diabetes mellitus without complications: Secondary | ICD-10-CM | POA: Diagnosis not present

## 2023-11-12 ENCOUNTER — Other Ambulatory Visit: Payer: Self-pay

## 2023-11-12 DIAGNOSIS — I10 Essential (primary) hypertension: Secondary | ICD-10-CM

## 2023-11-12 MED ORDER — LISINOPRIL 40 MG PO TABS: 40.0000 mg | ORAL_TABLET | Freq: Every day | ORAL | 1 refills | Status: AC

## 2023-11-17 NOTE — Progress Notes (Signed)
 Brittney Murray                                          MRN: [id number]   11/17/2023   The VBCI Quality Team Specialist reviewed this patient medical record for the purposes of chart review for care gap closure. The following were reviewed: chart review for care gap closure-controlling blood pressure.    VBCI Quality Team

## 2023-11-17 NOTE — Progress Notes (Signed)
 Brittney Murray                                          MRN: [id number]   11/17/2023   The VBCI Quality Team Specialist reviewed this patient medical record for the purposes of chart review for care gap closure. The following were reviewed: chart review for care gap closure-diabetic eye exam.    VBCI Quality Team

## 2023-11-25 DIAGNOSIS — E119 Type 2 diabetes mellitus without complications: Secondary | ICD-10-CM | POA: Diagnosis not present

## 2023-11-29 ENCOUNTER — Other Ambulatory Visit: Payer: Self-pay | Admitting: Family Medicine

## 2023-12-23 ENCOUNTER — Other Ambulatory Visit: Payer: Self-pay | Admitting: Family Medicine

## 2023-12-26 DIAGNOSIS — E119 Type 2 diabetes mellitus without complications: Secondary | ICD-10-CM | POA: Diagnosis not present

## 2023-12-27 ENCOUNTER — Other Ambulatory Visit: Payer: Self-pay | Admitting: Family Medicine

## 2024-01-01 ENCOUNTER — Telehealth: Payer: Self-pay

## 2024-01-01 NOTE — Telephone Encounter (Signed)
 Copied from CRM #[id number]. Topic: Clinical - Prescription Issue >> Jan 01, 2024  4:47 PM Kevelyn M wrote: Reason for CRM: Patient is calling in about the potassium citrate  (UROCIT-K ) 10 MEQ (1080 MG) SR tablet. She is asking why it's being declined. Notified patient that it was too soon when last sent on 12/27/2023. Patient does not have any pills left.  Call back #[phone]

## 2024-01-03 ENCOUNTER — Other Ambulatory Visit: Payer: Self-pay | Admitting: Family Medicine

## 2024-01-03 ENCOUNTER — Encounter: Payer: Self-pay | Admitting: Family Medicine

## 2024-01-03 DIAGNOSIS — I10 Essential (primary) hypertension: Secondary | ICD-10-CM

## 2024-01-04 ENCOUNTER — Other Ambulatory Visit: Payer: Self-pay | Admitting: Family Medicine

## 2024-01-04 MED ORDER — POTASSIUM CITRATE ER 10 MEQ (1080 MG) PO TBCR: 10.0000 meq | EXTENDED_RELEASE_TABLET | Freq: Three times a day (TID) | ORAL | 1 refills | Status: AC

## 2024-01-04 NOTE — Telephone Encounter (Signed)
 Called patient, no answer, Lvm informing the patient on the orders that was placed for the potassium and that it's important that the potassium shouldn't be too high or too low.

## 2024-01-05 ENCOUNTER — Ambulatory Visit: Payer: Self-pay | Admitting: Family Medicine

## 2024-01-05 LAB — BASIC METABOLIC PANEL WITH GFR
BUN/Creatinine Ratio: 17 (ref 12–28)
BUN: 16 mg/dL (ref 8–27)
CO2: 24 mmol/L (ref 20–29)
Calcium: 9.6 mg/dL (ref 8.7–10.3)
Chloride: 101 mmol/L (ref 96–106)
Creatinine, Ser: 0.94 mg/dL (ref 0.57–1.00)
Glucose: 119 mg/dL — ABNORMAL HIGH (ref 70–99)
Potassium: 4.9 mmol/L (ref 3.5–5.2)
Sodium: 139 mmol/L (ref 134–144)
eGFR: 69 mL/min/1.73 (ref >59)

## 2024-03-03 ENCOUNTER — Other Ambulatory Visit: Payer: Self-pay | Admitting: Family Medicine

## 2024-03-18 ENCOUNTER — Other Ambulatory Visit: Payer: Self-pay | Admitting: Family Medicine

## 2024-03-25 ENCOUNTER — Ambulatory Visit: Payer: Self-pay | Admitting: Family Medicine

## 2024-03-25 NOTE — Telephone Encounter (Signed)
 FYI Only or Action Required?: Action required by provider: request for appointment, medication refill request, and update on patient condition.  Patient was last seen in primary care on 07/07/2023 by Colette Torrence GRADE, MD.  Called Nurse Triage reporting Dizziness.  Symptoms began a week ago.  Interventions attempted: Prescription medications: lisinopril .  Symptoms are: stable.  Triage Disposition: See Physician Within 24 Hours  Patient/caregiver understands and will follow disposition?:  No             Message from [place] sent at 03/25/2024  2:44 PM EST  Reason for Triage: dizziness from vertigo   Reason for Disposition  [1] NO dizziness now AND [2] one or more stroke risk factors (i.e., hypertension, diabetes, prior stroke/TIA/heart attack)  Answer Assessment - Initial Assessment Questions 1. DESCRIPTION: Describe your dizziness.     Spinning.  2. VERTIGO: Do you feel like either you or the room is spinning or tilting?      Yes.  3. LIGHTHEADED: Do you feel lightheaded? (e.g., somewhat faint, woozy, weak upon standing)     No.  4. SEVERITY: How bad is it?  Can you walk?     Mild. Not present now. She states she looks at a still object and it will pass.  5. ONSET:  When did the dizziness begin?     1 week ago.  6. AGGRAVATING FACTORS: Does anything make it worse? (e.g., standing, change in head position)     Rolling/turning over in bed; changing head position  7. CAUSE: What do you think is causing the dizziness?     Sinus congestion and has been out of amlodipine  for 3 days, refill was denied due to overdue for office visit.   8. RECURRENT SYMPTOM: Have you had dizziness before? If Yes, ask: When was the last time? What happened that time?     Yes, history of vertigo. Last episode about a year ago. It accompanied with a sinus/URI. Treated with steroids and antibiotic.  9. OTHER SYMPTOMS: Do you have any other symptoms? (e.g.,  earache, headache, numbness, tinnitus, vomiting, weakness)     Sinus congestion (bloody nasal discharge) x 1 week; Headache 3/10, nagging. No unilateral numbness or weakness, changes in speech or vision, severe dizziness/falls.  Protocols used: Dizziness - Vertigo-A-AH

## 2024-03-25 NOTE — Telephone Encounter (Signed)
 Attempted to contact patient but unfortunately was sent to voicemail. I have left a voice message requesting a call back to our office. Would it be appropriate to provide patient with a med refill for AMLODIPINE  to reach her appointment date on 04/18/2024?

## 2024-03-27 ENCOUNTER — Encounter: Payer: Self-pay | Admitting: Family Medicine

## 2024-03-27 MED ORDER — AMLODIPINE BESYLATE 10 MG PO TABS: 10.0000 mg | ORAL_TABLET | Freq: Every day | ORAL | 0 refills | Status: AC

## 2024-03-27 NOTE — Telephone Encounter (Signed)
 Refilled Amlodipine  for 90 days. Needs OV to address other issues of which she already has scheduled.

## 2024-03-29 ENCOUNTER — Other Ambulatory Visit: Payer: Self-pay | Admitting: Family Medicine

## 2024-03-29 NOTE — Telephone Encounter (Signed)
 Copied from CRM #[id number]. Topic: General - Other >> Mar 25, 2024  5:53 PM Zebedee SAUNDERS wrote: Reason for CRM: Pt returning Brittney Murray, CMA call regarding message which was provided update on AMLODIPINE .

## 2024-04-18 ENCOUNTER — Ambulatory Visit: Admitting: Family Medicine
